# Patient Record
Sex: Female | Born: 1980 | Race: Black or African American | Hispanic: No | Smoking: Former smoker
Health system: Southern US, Community
[De-identification: ages and names within clinical notes are randomized; demographics above are authoritative.]

## PROBLEM LIST (undated history)

## (undated) DIAGNOSIS — T7840XA Allergy, unspecified, initial encounter: Secondary | ICD-10-CM

## (undated) DIAGNOSIS — J302 Other seasonal allergic rhinitis: Secondary | ICD-10-CM

## (undated) DIAGNOSIS — N920 Excessive and frequent menstruation with regular cycle: Secondary | ICD-10-CM

## (undated) DIAGNOSIS — N926 Irregular menstruation, unspecified: Secondary | ICD-10-CM

## (undated) DIAGNOSIS — M7071 Other bursitis of hip, right hip: Secondary | ICD-10-CM

## (undated) DIAGNOSIS — D251 Intramural leiomyoma of uterus: Secondary | ICD-10-CM

## (undated) DIAGNOSIS — F17200 Nicotine dependence, unspecified, uncomplicated: Secondary | ICD-10-CM

## (undated) DIAGNOSIS — J4540 Moderate persistent asthma, uncomplicated: Secondary | ICD-10-CM

## (undated) DIAGNOSIS — Z9109 Other allergy status, other than to drugs and biological substances: Secondary | ICD-10-CM

## (undated) DIAGNOSIS — F988 Other specified behavioral and emotional disorders with onset usually occurring in childhood and adolescence: Secondary | ICD-10-CM

## (undated) DIAGNOSIS — G43909 Migraine, unspecified, not intractable, without status migrainosus: Secondary | ICD-10-CM

## (undated) DIAGNOSIS — D509 Iron deficiency anemia, unspecified: Secondary | ICD-10-CM

## (undated) DIAGNOSIS — N979 Female infertility, unspecified: Secondary | ICD-10-CM

## (undated) HISTORY — PX: MYOMECTOMY: SHX85

## (undated) HISTORY — DX: Other specified behavioral and emotional disorders with onset usually occurring in childhood and adolescence: F98.8

## (undated) HISTORY — DX: Allergy, unspecified, initial encounter: T78.40XA

## (undated) HISTORY — DX: Nicotine dependence, unspecified, uncomplicated: F17.200

---
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
## 2001-12-28 ENCOUNTER — Other Ambulatory Visit: Admission: RE | Admit: 2001-12-28 | Discharge: 2001-12-28 | Payer: Self-pay | Admitting: *Deleted

## 2002-01-10 ENCOUNTER — Emergency Department (HOSPITAL_COMMUNITY): Admission: EM | Admit: 2002-01-10 | Discharge: 2002-01-10 | Payer: Self-pay | Admitting: Emergency Medicine

## 2002-01-10 ENCOUNTER — Encounter: Payer: Self-pay | Admitting: Emergency Medicine

## 2002-01-23 ENCOUNTER — Emergency Department (HOSPITAL_COMMUNITY): Admission: EM | Admit: 2002-01-23 | Discharge: 2002-01-23 | Payer: Self-pay | Admitting: Emergency Medicine

## 2002-05-31 ENCOUNTER — Encounter: Payer: Self-pay | Admitting: Gynecology

## 2002-05-31 ENCOUNTER — Ambulatory Visit (HOSPITAL_COMMUNITY): Admission: RE | Admit: 2002-05-31 | Discharge: 2002-05-31 | Payer: Self-pay | Admitting: Gynecology

## 2004-01-03 ENCOUNTER — Other Ambulatory Visit: Admission: RE | Admit: 2004-01-03 | Discharge: 2004-01-03 | Payer: Self-pay | Admitting: Gynecology

## 2004-02-21 ENCOUNTER — Emergency Department (HOSPITAL_COMMUNITY): Admission: EM | Admit: 2004-02-21 | Discharge: 2004-02-21 | Payer: Self-pay | Admitting: Family Medicine

## 2004-06-11 ENCOUNTER — Emergency Department (HOSPITAL_COMMUNITY): Admission: EM | Admit: 2004-06-11 | Discharge: 2004-06-11 | Payer: Self-pay | Admitting: Family Medicine

## 2004-08-08 ENCOUNTER — Emergency Department (HOSPITAL_COMMUNITY): Admission: EM | Admit: 2004-08-08 | Discharge: 2004-08-08 | Payer: Self-pay | Admitting: Family Medicine

## 2004-08-13 ENCOUNTER — Emergency Department (HOSPITAL_COMMUNITY): Admission: EM | Admit: 2004-08-13 | Discharge: 2004-08-13 | Payer: Self-pay | Admitting: Family Medicine

## 2004-09-04 ENCOUNTER — Emergency Department (HOSPITAL_COMMUNITY): Admission: EM | Admit: 2004-09-04 | Discharge: 2004-09-04 | Payer: Self-pay | Admitting: Emergency Medicine

## 2004-09-28 ENCOUNTER — Ambulatory Visit: Payer: Self-pay | Admitting: Nurse Practitioner

## 2004-10-01 ENCOUNTER — Ambulatory Visit: Payer: Self-pay | Admitting: *Deleted

## 2004-10-21 ENCOUNTER — Emergency Department (HOSPITAL_COMMUNITY): Admission: EM | Admit: 2004-10-21 | Discharge: 2004-10-21 | Payer: Self-pay | Admitting: Emergency Medicine

## 2004-11-23 ENCOUNTER — Ambulatory Visit: Payer: Self-pay | Admitting: Nurse Practitioner

## 2004-12-05 ENCOUNTER — Emergency Department (HOSPITAL_COMMUNITY): Admission: EM | Admit: 2004-12-05 | Discharge: 2004-12-05 | Payer: Self-pay | Admitting: Family Medicine

## 2004-12-23 ENCOUNTER — Emergency Department (HOSPITAL_COMMUNITY): Admission: EM | Admit: 2004-12-23 | Discharge: 2004-12-23 | Payer: Self-pay | Admitting: Family Medicine

## 2004-12-24 ENCOUNTER — Emergency Department (HOSPITAL_COMMUNITY): Admission: EM | Admit: 2004-12-24 | Discharge: 2004-12-24 | Payer: Self-pay | Admitting: *Deleted

## 2005-03-06 ENCOUNTER — Emergency Department (HOSPITAL_COMMUNITY): Admission: EM | Admit: 2005-03-06 | Discharge: 2005-03-06 | Payer: Self-pay | Admitting: Emergency Medicine

## 2005-04-16 ENCOUNTER — Other Ambulatory Visit: Admission: RE | Admit: 2005-04-16 | Discharge: 2005-04-16 | Payer: Self-pay | Admitting: Obstetrics and Gynecology

## 2005-05-17 ENCOUNTER — Inpatient Hospital Stay (HOSPITAL_COMMUNITY): Admission: AD | Admit: 2005-05-17 | Discharge: 2005-05-17 | Payer: Self-pay | Admitting: *Deleted

## 2005-06-15 ENCOUNTER — Emergency Department (HOSPITAL_COMMUNITY): Admission: EM | Admit: 2005-06-15 | Discharge: 2005-06-15 | Payer: Self-pay | Admitting: Family Medicine

## 2005-07-14 ENCOUNTER — Inpatient Hospital Stay (HOSPITAL_COMMUNITY): Admission: AD | Admit: 2005-07-14 | Discharge: 2005-07-14 | Payer: Self-pay | Admitting: Obstetrics and Gynecology

## 2005-08-02 ENCOUNTER — Inpatient Hospital Stay (HOSPITAL_COMMUNITY): Admission: AD | Admit: 2005-08-02 | Discharge: 2005-08-02 | Payer: Self-pay | Admitting: Obstetrics & Gynecology

## 2005-10-08 ENCOUNTER — Emergency Department: Payer: Self-pay | Admitting: Emergency Medicine

## 2006-01-10 ENCOUNTER — Emergency Department: Payer: Self-pay | Admitting: Emergency Medicine

## 2006-03-17 ENCOUNTER — Emergency Department: Payer: Self-pay | Admitting: Unknown Physician Specialty

## 2007-02-15 ENCOUNTER — Emergency Department: Payer: Self-pay | Admitting: Emergency Medicine

## 2007-05-19 ENCOUNTER — Emergency Department (HOSPITAL_COMMUNITY): Admission: EM | Admit: 2007-05-19 | Discharge: 2007-05-19 | Payer: Self-pay | Admitting: Family Medicine

## 2007-06-25 ENCOUNTER — Emergency Department (HOSPITAL_COMMUNITY): Admission: EM | Admit: 2007-06-25 | Discharge: 2007-06-25 | Payer: Self-pay | Admitting: Emergency Medicine

## 2007-07-01 ENCOUNTER — Encounter: Admission: RE | Admit: 2007-07-01 | Discharge: 2007-07-01 | Payer: Self-pay | Admitting: Family Medicine

## 2007-07-01 ENCOUNTER — Ambulatory Visit: Payer: Self-pay | Admitting: Family Medicine

## 2007-08-03 ENCOUNTER — Ambulatory Visit: Payer: Self-pay | Admitting: Family Medicine

## 2007-09-16 ENCOUNTER — Emergency Department (HOSPITAL_COMMUNITY): Admission: EM | Admit: 2007-09-16 | Discharge: 2007-09-16 | Payer: Self-pay | Admitting: Emergency Medicine

## 2007-12-16 ENCOUNTER — Ambulatory Visit: Payer: Self-pay | Admitting: Family Medicine

## 2009-01-11 ENCOUNTER — Emergency Department (HOSPITAL_COMMUNITY): Admission: EM | Admit: 2009-01-11 | Discharge: 2009-01-11 | Payer: Self-pay | Admitting: Emergency Medicine

## 2009-03-24 ENCOUNTER — Ambulatory Visit (HOSPITAL_COMMUNITY): Admission: RE | Admit: 2009-03-24 | Discharge: 2009-03-24 | Payer: Self-pay | Admitting: Obstetrics

## 2009-04-23 IMAGING — CR DG HIP (WITH OR WITHOUT PELVIS) 2-3V*L*
2 series · 2 of 2 positions shown · non-contrast
Comparison: None

CLINICAL DATA: Left hip and groin pain

LEFT HIP - COMPLETE 2+ VIEW

[view not recorded (1 of 2)]
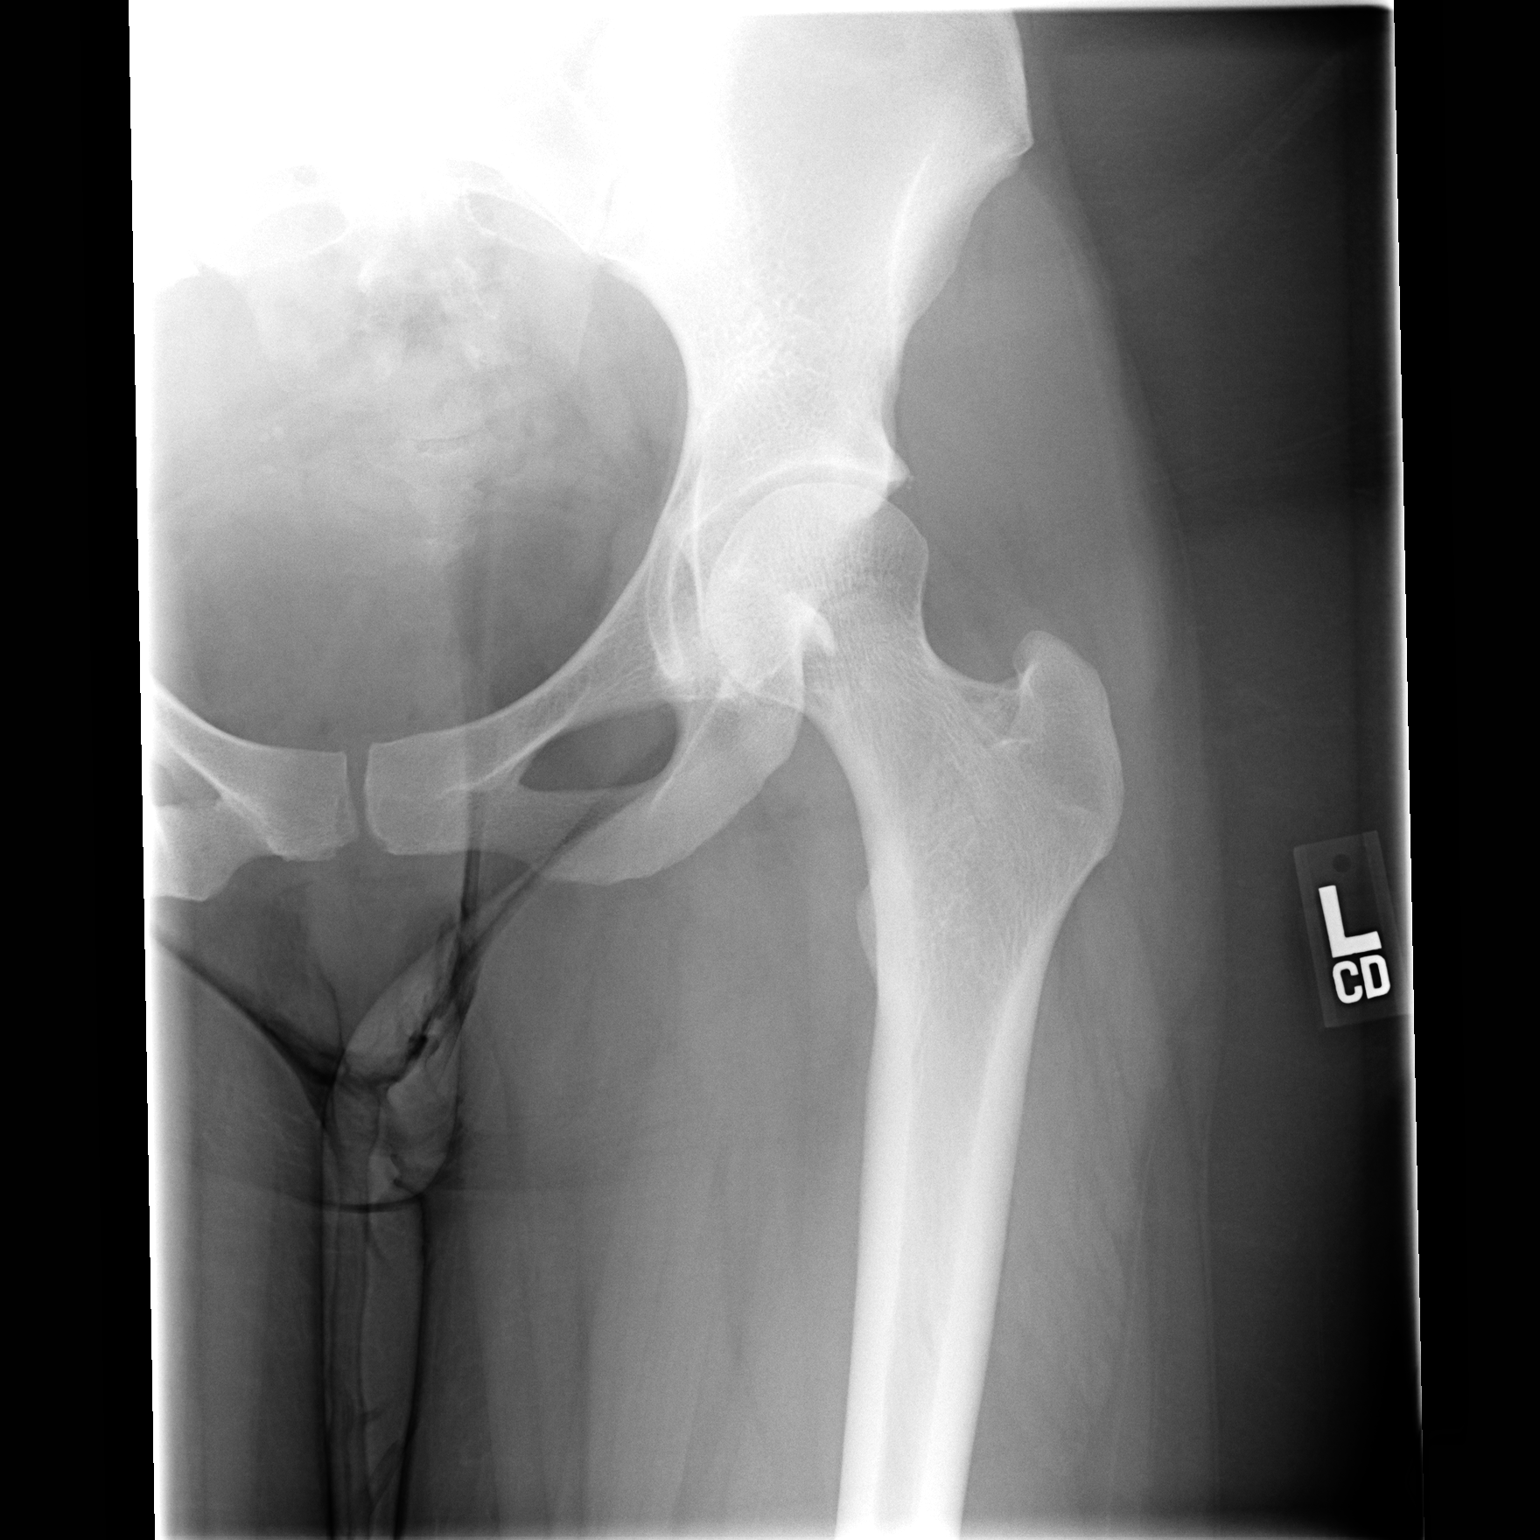

[view not recorded (2 of 2)]
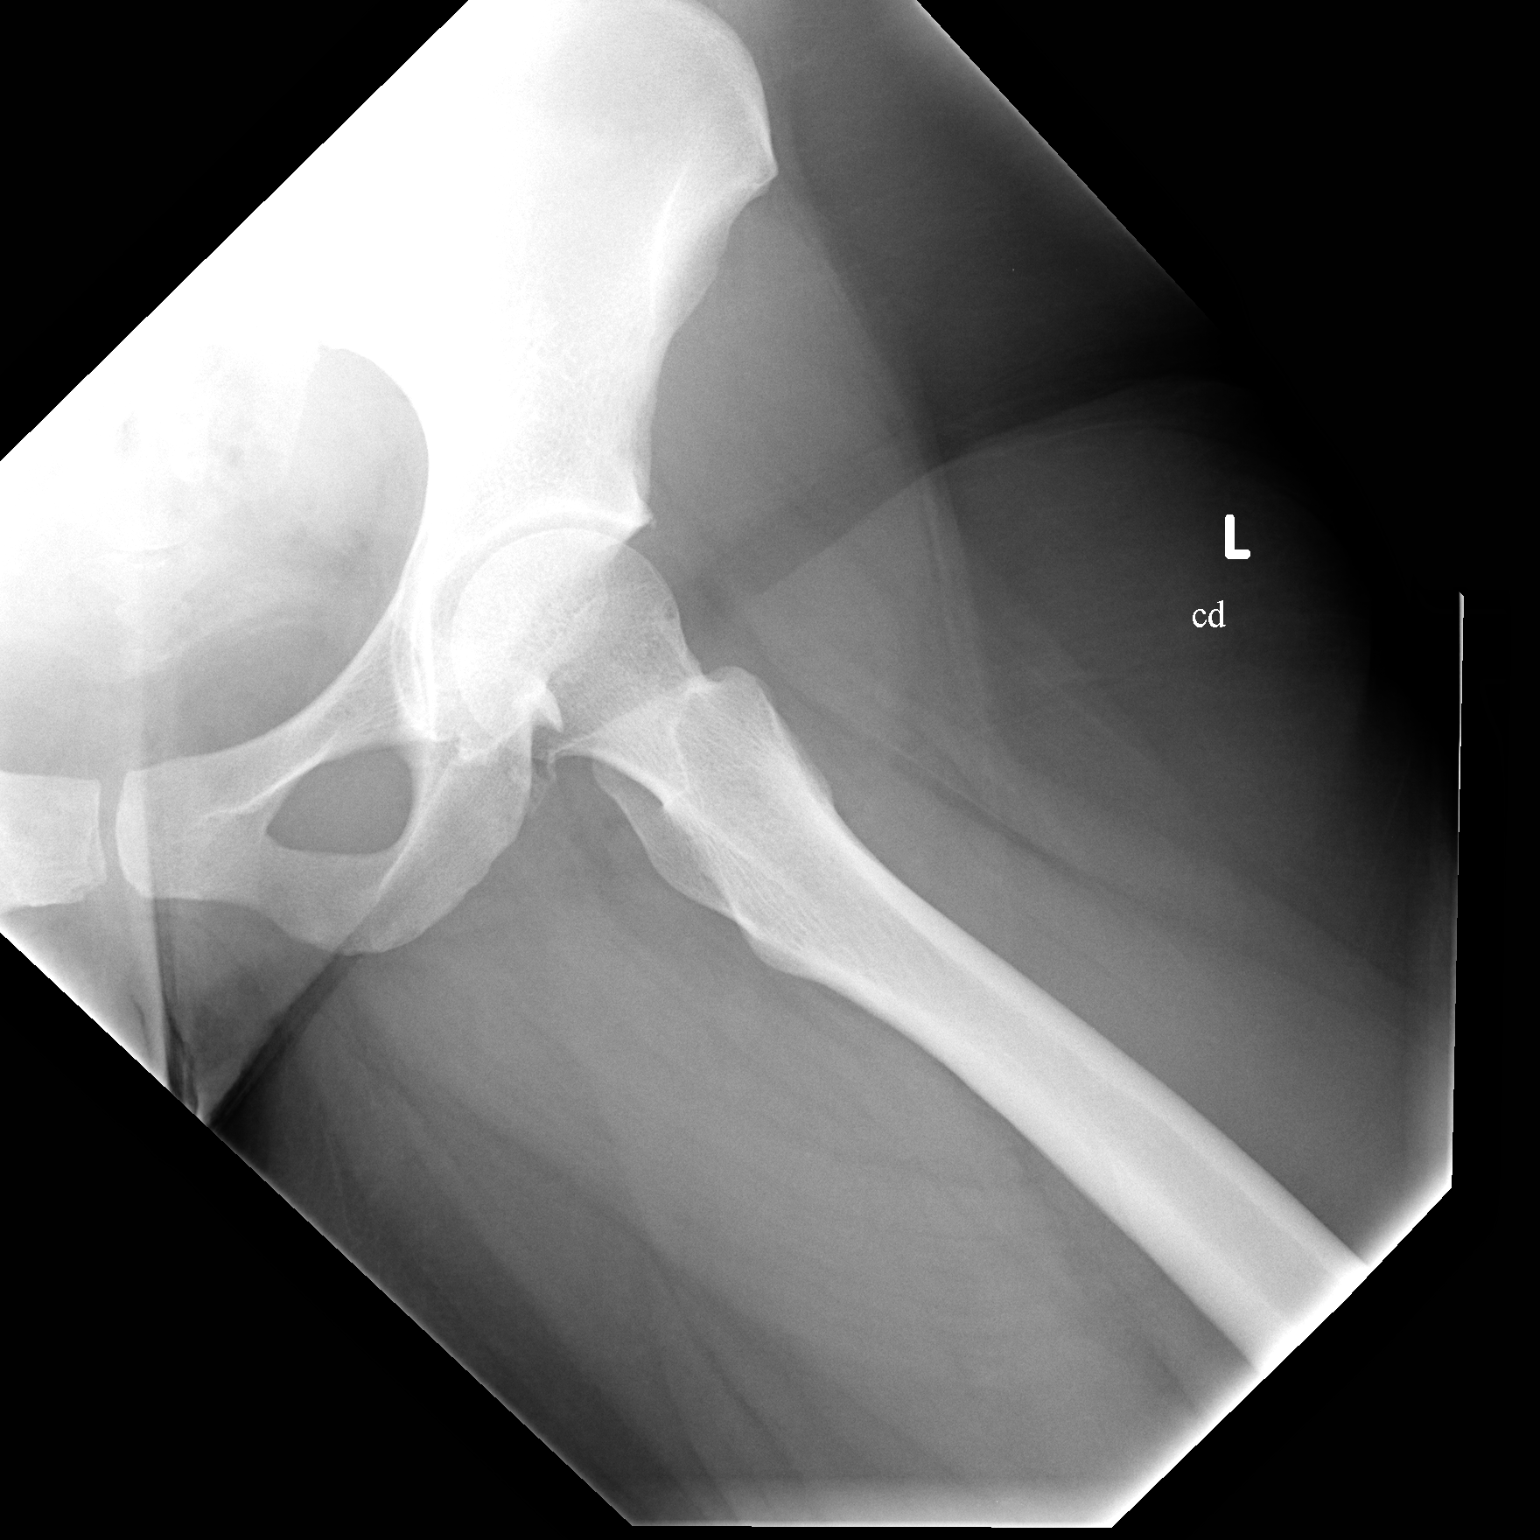

[2 of 2 positions shown; findings below may reference images not displayed]

FINDINGS: No acute abnormality is seen.  The left hip joint space
is normal.  The ramus is intact.  There is a bony density
projecting from the left acetabulum which most likely is due to
prior trauma with degenerative change.
IMPRESSION: No acute abnormality.  Probable degenerative spur emanating from
the left acetabulum.

## 2009-05-02 ENCOUNTER — Ambulatory Visit: Payer: Self-pay | Admitting: Family Medicine

## 2009-05-16 ENCOUNTER — Ambulatory Visit: Payer: Self-pay | Admitting: Family Medicine

## 2010-03-01 ENCOUNTER — Ambulatory Visit
Admission: RE | Admit: 2010-03-01 | Discharge: 2010-03-01 | Payer: Self-pay | Source: Home / Self Care | Attending: Family Medicine | Admitting: Family Medicine

## 2010-04-09 ENCOUNTER — Ambulatory Visit: Payer: Self-pay | Admitting: Physician Assistant

## 2010-04-09 ENCOUNTER — Ambulatory Visit (INDEPENDENT_AMBULATORY_CARE_PROVIDER_SITE_OTHER): Admitting: Physician Assistant

## 2010-04-09 DIAGNOSIS — L600 Ingrowing nail: Secondary | ICD-10-CM

## 2010-05-30 LAB — URINALYSIS, ROUTINE W REFLEX MICROSCOPIC
Bilirubin Urine: NEGATIVE
Glucose, UA: NEGATIVE mg/dL
Hgb urine dipstick: NEGATIVE
Ketones, ur: NEGATIVE mg/dL
Nitrite: NEGATIVE
Protein, ur: NEGATIVE mg/dL
Specific Gravity, Urine: 1.024 (ref 1.005–1.030)
Urobilinogen, UA: 0.2 mg/dL (ref 0.0–1.0)
pH: 7 (ref 5.0–8.0)

## 2010-05-30 LAB — CBC
HCT: 40.5 % (ref 36.0–46.0)
Hemoglobin: 13.5 g/dL (ref 12.0–15.0)
MCHC: 33.3 g/dL (ref 30.0–36.0)
MCV: 96.7 fL (ref 78.0–100.0)
Platelets: 285 10*3/uL (ref 150–400)
RBC: 4.19 MIL/uL (ref 3.87–5.11)
RDW: 13.1 % (ref 11.5–15.5)
WBC: 5.1 10*3/uL (ref 4.0–10.5)

## 2010-05-30 LAB — COMPREHENSIVE METABOLIC PANEL
ALT: 29 U/L (ref 0–35)
AST: 30 U/L (ref 0–37)
Albumin: 3.9 g/dL (ref 3.5–5.2)
Alkaline Phosphatase: 59 U/L (ref 39–117)
BUN: 9 mg/dL (ref 6–23)
CO2: 27 mEq/L (ref 19–32)
Calcium: 9.1 mg/dL (ref 8.4–10.5)
Chloride: 104 mEq/L (ref 96–112)
Creatinine, Ser: 0.92 mg/dL (ref 0.4–1.2)
GFR calc Af Amer: >60 mL/min (ref >60)
GFR calc non Af Amer: >60 mL/min (ref >60)
Glucose, Bld: 114 mg/dL — ABNORMAL HIGH (ref 70–99)
Potassium: 4.3 mEq/L (ref 3.5–5.1)
Sodium: 138 mEq/L (ref 135–145)
Total Bilirubin: 0.4 mg/dL (ref 0.3–1.2)
Total Protein: 7 g/dL (ref 6.0–8.3)

## 2010-05-30 LAB — DIFFERENTIAL
Basophils Absolute: 0 10*3/uL (ref 0.0–0.1)
Basophils Relative: 1 % (ref 0–1)
Eosinophils Absolute: 0.1 10*3/uL (ref 0.0–0.7)
Eosinophils Relative: 2 % (ref 0–5)
Lymphocytes Relative: 30 % (ref 12–46)
Lymphs Abs: 1.5 10*3/uL (ref 0.7–4.0)
Monocytes Absolute: 0.3 10*3/uL (ref 0.1–1.0)
Monocytes Relative: 6 % (ref 3–12)
Neutro Abs: 3.1 10*3/uL (ref 1.7–7.7)
Neutrophils Relative %: 62 % (ref 43–77)

## 2010-05-30 LAB — PREGNANCY, URINE: Preg Test, Ur: NEGATIVE

## 2010-05-30 LAB — LIPASE, BLOOD: Lipase: 16 U/L (ref 11–59)

## 2010-06-07 ENCOUNTER — Ambulatory Visit (INDEPENDENT_AMBULATORY_CARE_PROVIDER_SITE_OTHER): Admitting: Family Medicine

## 2010-06-07 DIAGNOSIS — R109 Unspecified abdominal pain: Secondary | ICD-10-CM

## 2010-07-06 ENCOUNTER — Other Ambulatory Visit: Payer: Self-pay | Admitting: Family Medicine

## 2010-07-06 ENCOUNTER — Telehealth: Payer: Self-pay | Admitting: Family Medicine

## 2010-07-06 MED ORDER — FLUCONAZOLE 150 MG PO TABS: 150.0000 mg | ORAL_TABLET | Freq: Once | ORAL | Status: AC

## 2010-07-06 NOTE — Telephone Encounter (Signed)
DONE

## 2010-08-09 ENCOUNTER — Encounter: Payer: Self-pay | Admitting: Family Medicine

## 2010-08-09 DIAGNOSIS — E669 Obesity, unspecified: Secondary | ICD-10-CM | POA: Insufficient documentation

## 2010-08-09 DIAGNOSIS — F17200 Nicotine dependence, unspecified, uncomplicated: Secondary | ICD-10-CM | POA: Insufficient documentation

## 2010-08-09 DIAGNOSIS — A6009 Herpesviral infection of other urogenital tract: Secondary | ICD-10-CM | POA: Insufficient documentation

## 2010-08-15 ENCOUNTER — Encounter: Payer: Self-pay | Admitting: Family Medicine

## 2010-08-15 ENCOUNTER — Ambulatory Visit (INDEPENDENT_AMBULATORY_CARE_PROVIDER_SITE_OTHER): Admitting: Family Medicine

## 2010-08-15 VITALS — BP 122/86 | HR 76 | Ht 65.5 in | Wt 230.0 lb

## 2010-08-15 DIAGNOSIS — J45901 Unspecified asthma with (acute) exacerbation: Secondary | ICD-10-CM | POA: Insufficient documentation

## 2010-08-15 DIAGNOSIS — Z23 Encounter for immunization: Secondary | ICD-10-CM

## 2010-08-15 DIAGNOSIS — B351 Tinea unguium: Secondary | ICD-10-CM

## 2010-08-15 DIAGNOSIS — F17200 Nicotine dependence, unspecified, uncomplicated: Secondary | ICD-10-CM

## 2010-08-15 MED ORDER — ALBUTEROL SULFATE HFA 108 (90 BASE) MCG/ACT IN AERS: 2.0000 | INHALATION_SPRAY | Freq: Four times a day (QID) | RESPIRATORY_TRACT | Status: DC | PRN

## 2010-08-15 MED ORDER — FLUTICASONE-SALMETEROL 250-50 MCG/DOSE IN AEPB: 1.0000 | INHALATION_SPRAY | Freq: Two times a day (BID) | RESPIRATORY_TRACT | Status: DC

## 2010-08-15 NOTE — Progress Notes (Signed)
Subjective:    Patient ID: Tonya Perkins, female    DOB: 24-Jun-1980, 30 y.o.   MRN: [id number]  HPI Presents for f/u on R ingrown toenail.  She was treated with Keflex for infection in the right toenail in March.  Now is having some problems with the left great toenail.  Painful to press on.  Using a topical fungicide, and wonders if this will help. She denies any further swelling/drainage/infection.  She continues to soak foot, and try to train the nail edges over the skin.  Patient is requesting Advair refill.  Asthma flares up when it is hot outside.  Ran out of inhaler 2 weeks ago.  Tried Primatene mist (OTC) but doesn't like it. Has also been out of medication for nebulizer.  Usually uses inhaler more in the summer time.  Feels like she needs inhaler more than twice a week; would like to go back on a preventive medication.   Previously took Advair, and did well, would like refill.  Review of chart doesn't show Advair Rx, but shows Singulair Rx last spring.  She didn't think it helped as much as the Advair.  She continues to smoke, but has cut down to just 1 cigarette daily  Past Medical History  Diagnosis Date  . Asthma   . Tobacco use disorder   . Genital herpes    Current Outpatient Prescriptions on File Prior to Visit  Medication Sig Dispense Refill  . Prenatal Vit-Fe Fumarate-FA (PRENATAL MULTIVITAMIN) 60-1 MG tablet Take 1 tablet by mouth daily.        Marland Kitchen acyclovir (ZOVIRAX) 400 MG tablet Take 400 mg by mouth 2 (two) times daily.         No Known Allergies  Review of Systems Denies fevers, allergy flare, headaches, GI complaints, cough, skin rashes or other concerns    Objective:   Physical Exam BP 122/86  Pulse 76  Ht 5' 5.5" (1.664 m)  Wt 230 lb (104.327 kg)  BMI 37.69 kg/m2  LMP 08/03/2010 Pleasant, obese African American female, speaking easily, in no distress HEENT:  PERRL, conjunctiva clear.  OP clear Neck:  No lymphadenopathy Heart:  Regular rate and rhythm  without murmur Lungs: Trace end-expiratory wheezing.  Fair air movement. No rales/ronchi Extremities:  No clubbing or edema.  Great toenails are very thickened, and curl inwards at both edges. No redness or tenderness at nail edges Psych:  Normal mood, affect, hygiene and grooming       Assessment & Plan:   1. Asthma exacerbation  Pneumococcal polysaccharide vaccine 23-valent greater than or equal to 2yo subcutaneous/IM, Fluticasone-Salmeterol (ADVAIR DISKUS) 250-50 MCG/DOSE AEPB, albuterol (PROVENTIL HFA;VENTOLIN HFA) 108 (90 BASE) MCG/ACT inhaler   Re-start Advair as preventative medication.  Quit smoking.   2. Onychomycosis    3. Tobacco use disorder     Strongly encouraged tobacco cessation, especially because she has asthma  4. Need for pneumococcal vaccination  Pneumococcal polysaccharide vaccine 23-valent greater than or equal to 2yo subcutaneous/IM   recommended because of her asthma and tobacco use   She is actively trying to get pregnant, therefore oral antifungals aren't recommended.  Discussed using pumice stone or file to decrease thickness of great toenails.  Continue soaking and training nail edges over the skin.  F/U with podiatrist if having ongoing nail problems.  Consider antifungal treatment when not trying for pregnancy.  Asthma: Discussed risks/benefits of Advair 250/50.  Refilled ventolin.  F/U if still needing rescue inhaler more than 2x/week.

## 2010-08-21 ENCOUNTER — Other Ambulatory Visit: Payer: Self-pay | Admitting: Family Medicine

## 2010-11-26 ENCOUNTER — Other Ambulatory Visit (HOSPITAL_COMMUNITY): Payer: Self-pay | Admitting: Obstetrics & Gynecology

## 2010-11-26 DIAGNOSIS — N979 Female infertility, unspecified: Secondary | ICD-10-CM

## 2010-12-03 ENCOUNTER — Ambulatory Visit (HOSPITAL_COMMUNITY)
Admission: RE | Admit: 2010-12-03 | Discharge: 2010-12-03 | Disposition: A | Discharge status: Home / Self Care | Source: Ambulatory Visit | Attending: Obstetrics & Gynecology | Admitting: Obstetrics & Gynecology

## 2010-12-03 DIAGNOSIS — N979 Female infertility, unspecified: Secondary | ICD-10-CM | POA: Insufficient documentation

## 2010-12-03 MED ORDER — IOHEXOL 300 MG/ML  SOLN: 7.0000 mL | Freq: Once | INTRAMUSCULAR | Status: AC | PRN

## 2011-01-15 IMAGING — US US PELVIS COMPLETE MODIFY
1 series · 14 of 25 positions shown · non-contrast
Comparison: None.

CLINICAL DATA: Anovulation.  No prior surgery.  LMP 03/12/2009

TRANSABDOMINAL AND TRANSVAGINAL ULTRASOUND OF PELVIS
TECHNIQUE: Both transabdominal and transvaginal ultrasound
examinations of the pelvis were performed including evaluation of
the uterus, ovaries, adnexal regions, and pelvic cul-de-sac.

[Series 1: us transvaginal non-ob · 0.26mm/px · 14 of 37 slices shown]
[im 1/37]
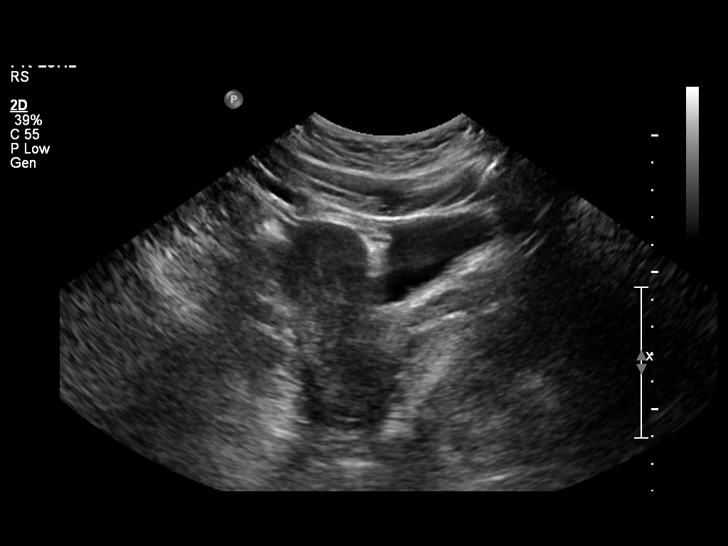
[im 4/37]
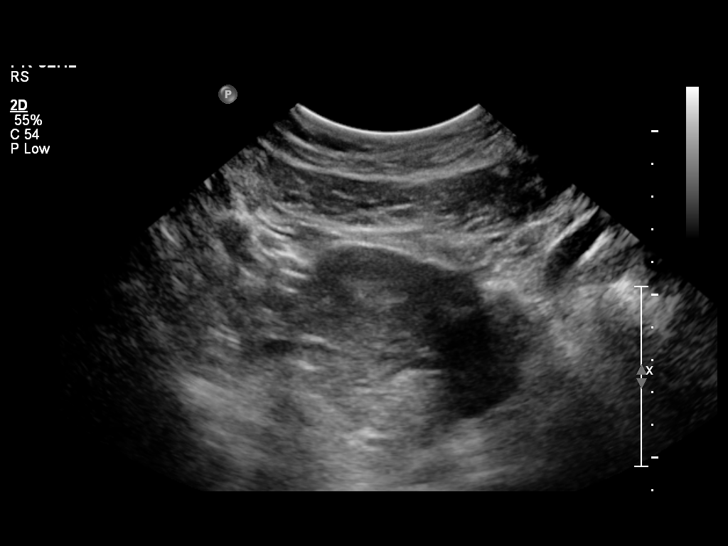
[im 7/37]
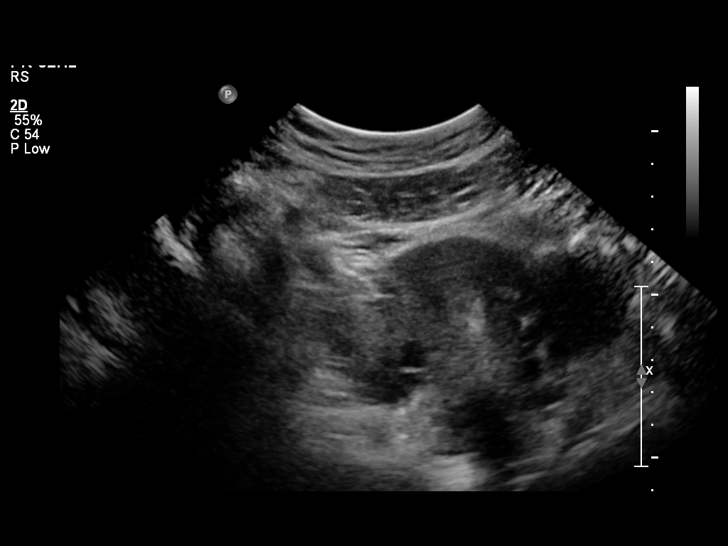
[im 10/37]
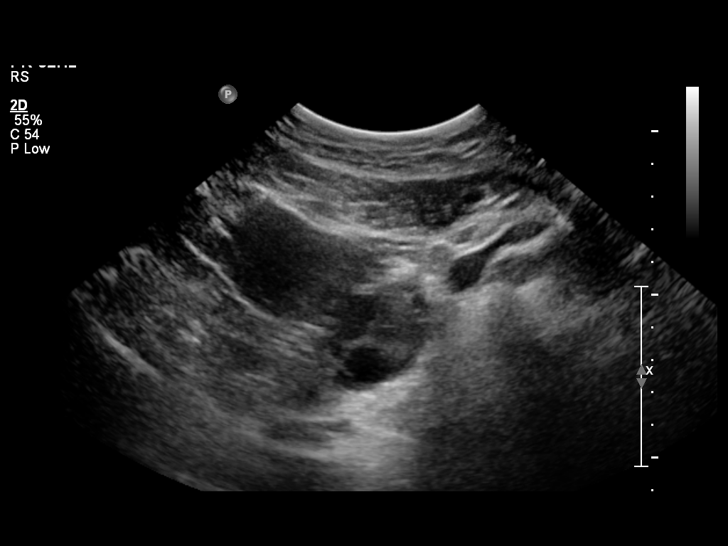
[im 13/37]
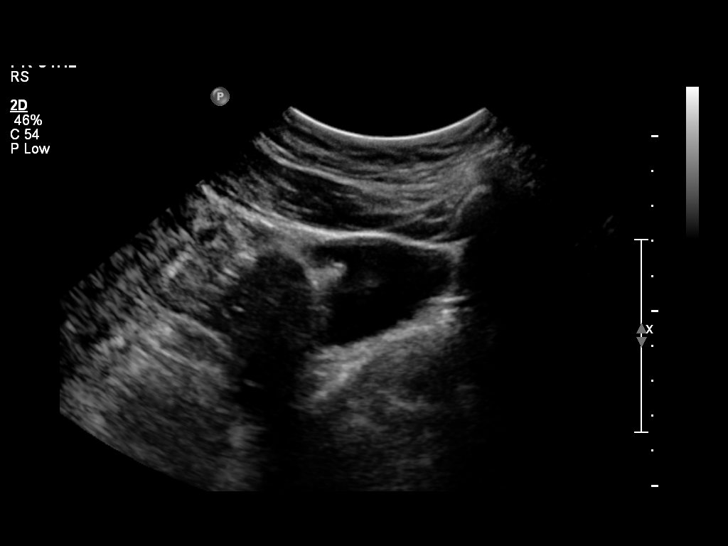
[im 14/37]
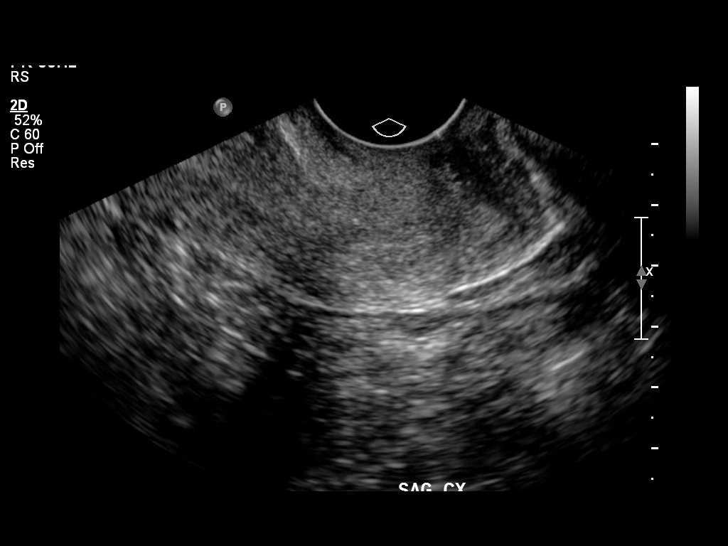
[im 17/37]
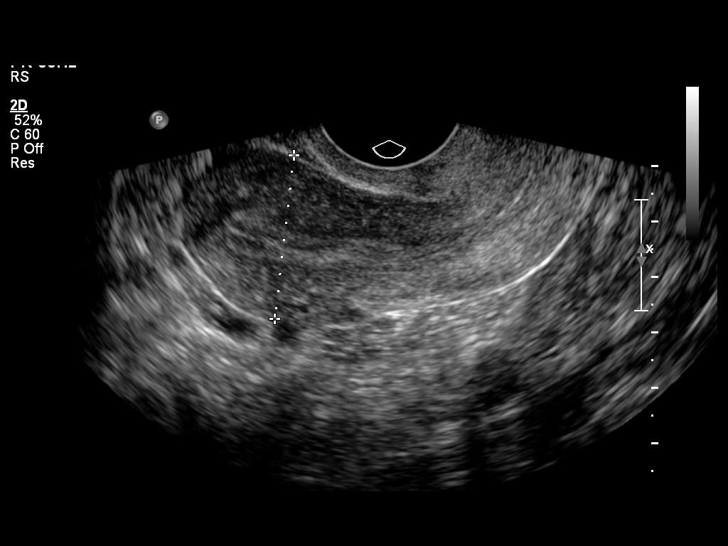
[im 20/37]
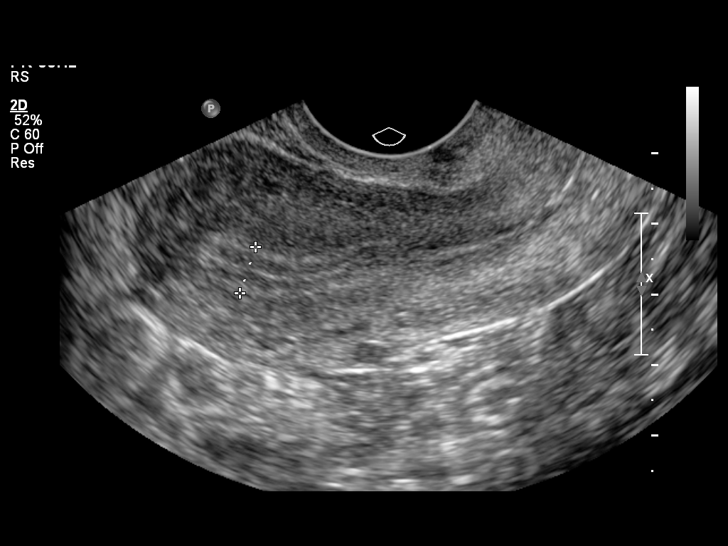
[im 23/37]
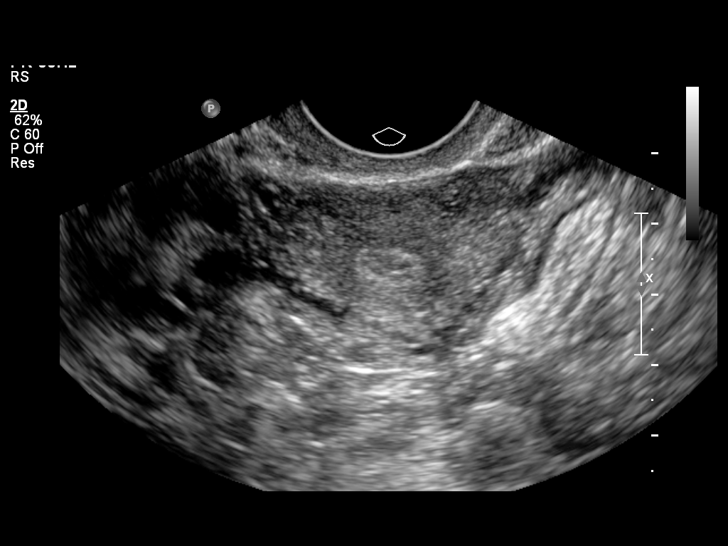
[im 25/37]
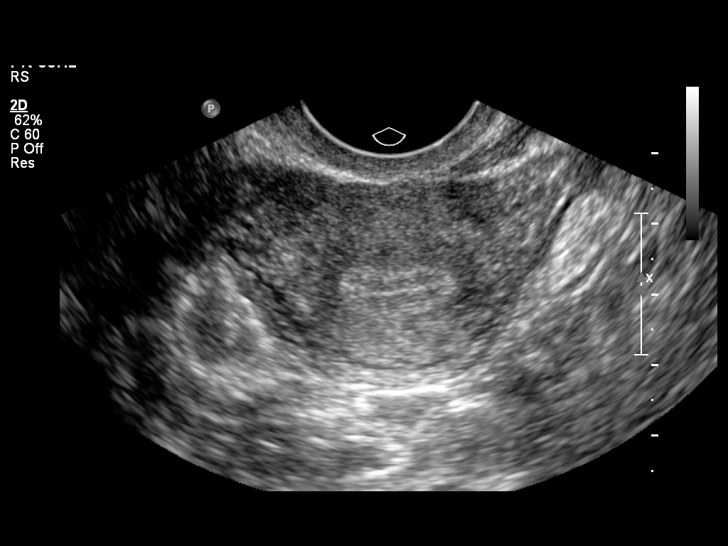
[im 28/37]
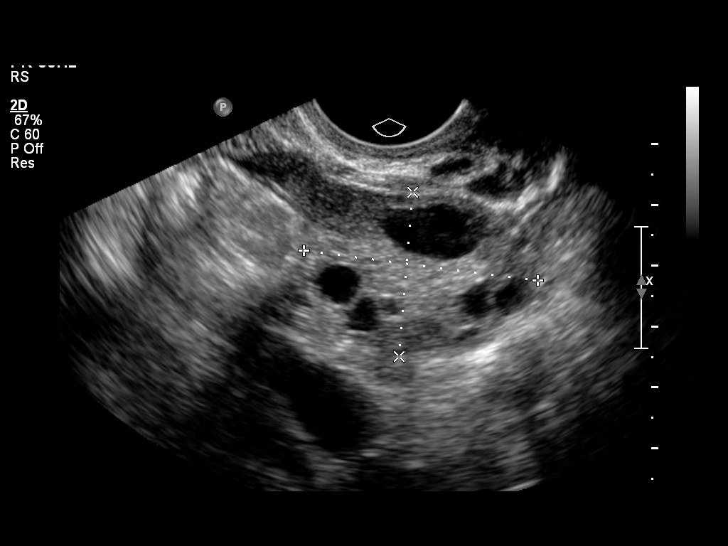
[im 31/37]
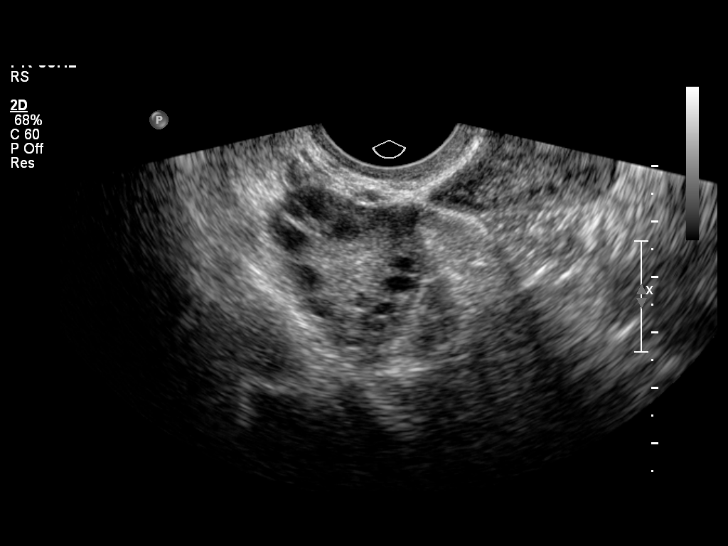
[im 34/37]
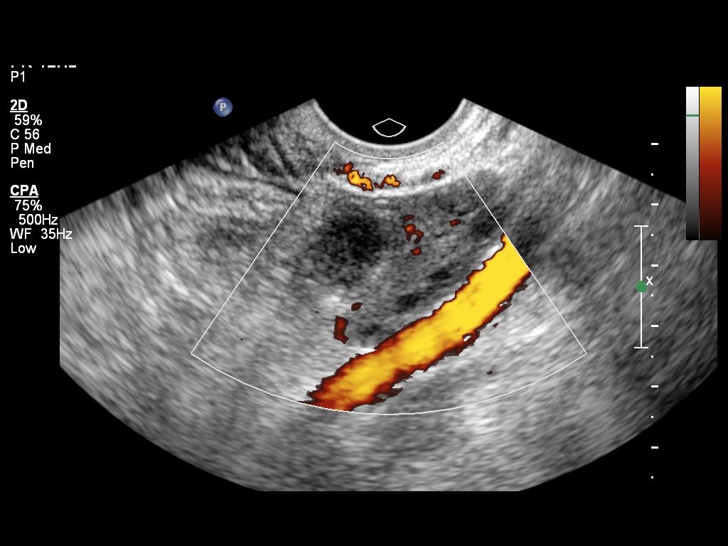
[im 37/37]
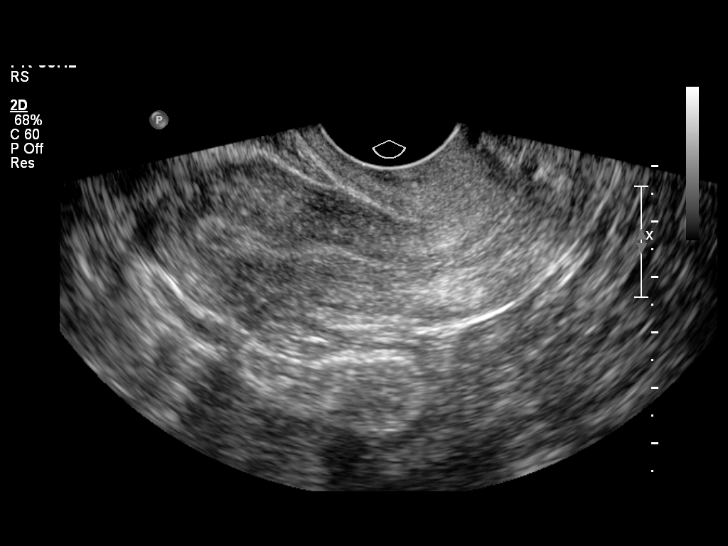

[14 of 25 positions shown; findings below may reference images not displayed]

FINDINGS: Uterus demonstrates a sagittal length of 7.4 cm, an AP width of
cm and a transverse width of 3.7 cm.  A homogeneous uterine
myometrium is seen.

Endometrium is tri- layered with an AP width of 6.9 mm.  This would
correlate with a periovulatory endometrial stripe and correspond
with the patient's given LMP of 03/12/2009.  No areas of focal
thickening or inhomogeneity are noted.

Right Ovary has a normal appearance measuring 3.9 x 2.7 x 2.6 cm
and containing multiple follicles

Left Ovary has a normal appearance measuring 4.3 x 2.1 x 3.0 cm and
containing multiple follicles

Other Findings:  No pelvic fluid or separate adnexal masses are
seen.
IMPRESSION: Normal periovulatory pelvic ultrasound.

## 2011-02-02 ENCOUNTER — Encounter (HOSPITAL_COMMUNITY): Payer: Self-pay

## 2011-02-02 ENCOUNTER — Emergency Department (INDEPENDENT_AMBULATORY_CARE_PROVIDER_SITE_OTHER)
Admission: EM | Admit: 2011-02-02 | Discharge: 2011-02-02 | Disposition: A | Discharge status: Home / Self Care | Source: Home / Self Care | Attending: Family Medicine | Admitting: Family Medicine

## 2011-02-02 DIAGNOSIS — R6889 Other general symptoms and signs: Secondary | ICD-10-CM

## 2011-02-02 DIAGNOSIS — J111 Influenza due to unidentified influenza virus with other respiratory manifestations: Secondary | ICD-10-CM

## 2011-02-02 MED ORDER — ALBUTEROL SULFATE HFA 108 (90 BASE) MCG/ACT IN AERS: 2.0000 | INHALATION_SPRAY | RESPIRATORY_TRACT | Status: DC | PRN

## 2011-02-02 MED ORDER — ACETAMINOPHEN 325 MG PO TABS
ORAL_TABLET | ORAL | Status: AC
  Filled 2011-02-02: qty 3

## 2011-02-02 MED ORDER — ACETAMINOPHEN 500 MG PO TABS
1000.0000 mg | ORAL_TABLET | Freq: Four times a day (QID) | ORAL | Status: DC | PRN
  Administered 2011-02-02: 1000 mg via ORAL

## 2011-02-02 NOTE — ED Provider Notes (Signed)
History     CSN: [id number] Arrival date & time: 02/02/2011  9:15 AM   First MD Initiated Contact with Patient 02/02/11 [phone]      Chief Complaint  Patient presents with  . Influenza    Pt has fever, cough and bodyaches since yesterday    (Consider location/radiation/quality/duration/timing/severity/associated sxs/prior treatment) HPI Comments: Tonya Perkins presents for evaluation of dry cough, fever, body aches, headaches, and sinus pressure. She reports hx of asthma; she is using her albuterol once daily with minimal relief.   Patient is a 30 y.o. female presenting with URI. The history is provided by the patient.  URI The primary symptoms include fever, fatigue, headaches, sore throat, cough and wheezing. The current episode started yesterday. This is a new problem.  The fever began yesterday. The maximum temperature recorded prior to her arrival was unknown. The temperature was taken by an oral thermometer.  The fatigue began yesterday. The fatigue has been unchanged since its onset.  The headache began yesterday. Headache is a new problem.  The cough began yesterday. The cough is new. The cough is non-productive and dry.  Wheezing began yesterday. The wheezing has been unchanged since its onset. The patient's medical history is significant for asthma.  The onset of the illness is associated with exposure to sick contacts. Symptoms associated with the illness include sinus pressure.    Past Medical History  Diagnosis Date  . Asthma   . Tobacco use disorder   . Genital herpes     Past Surgical History  Procedure Date  . Im nailing humerus   . Cervical fusion     History reviewed. No pertinent family history.  History  Substance Use Topics  . Smoking status: Current Everyday Smoker -- 0.1 packs/day    Types: Cigarettes  . Smokeless tobacco: Not on file   Comment: cut back to 1 cigarette a day  . Alcohol Use: Yes     1-2 drinks on weekends    OB History    Grav Para  Term Preterm Abortions TAB SAB Ect Mult Living                  Review of Systems  Constitutional: Positive for fever and fatigue.  HENT: Positive for sore throat and sinus pressure.   Eyes: Negative.   Respiratory: Positive for cough and wheezing.   Cardiovascular: Negative.   Gastrointestinal: Negative.   Genitourinary: Negative.   Musculoskeletal: Negative.   Neurological: Positive for headaches.    Allergies  Review of patient's allergies indicates no known allergies.  Home Medications   Current Outpatient Rx  Name Route Sig Dispense Refill  . MUCINEX CHILD COLD PO Oral Take by mouth.      . ACYCLOVIR 400 MG PO TABS  TAKE 1 TABLET BY MOUTH TWICE DAILY 60 tablet 0  . ALBUTEROL SULFATE HFA 108 (90 BASE) MCG/ACT IN AERS Inhalation Inhale 2 puffs into the lungs every 4 (four) hours as needed for wheezing or shortness of breath. 1 Inhaler 0  . FLUTICASONE-SALMETEROL 250-50 MCG/DOSE IN AEPB Inhalation Inhale 1 puff into the lungs 2 (two) times daily. Rinse mouth after use 60 each 5  . PRENATAL RX 60-1 MG PO TABS Oral Take 1 tablet by mouth daily.       BP 132/57  Pulse 107  Temp(Src) 100.8 F (38.2 C) (Oral)  Resp 19  SpO2 100%  LMP 01/20/2011  Physical Exam  Nursing note and vitals reviewed. Constitutional: She is oriented to person, place,  and time. She appears well-developed and well-nourished.  HENT:  Head: Normocephalic and atraumatic.  Right Ear: Tympanic membrane and external ear normal.  Left Ear: Tympanic membrane and external ear normal.  Nose: Nose normal.  Mouth/Throat: Uvula is midline, oropharynx is clear and moist and mucous membranes are normal.  Eyes: EOM are normal. Pupils are equal, round, and reactive to light.  Neck: Normal range of motion.  Cardiovascular: Normal rate and regular rhythm.   Pulmonary/Chest: Effort normal and breath sounds normal.  Neurological: She is alert and oriented to person, place, and time.  Skin: Skin is warm and dry.     ED Course  Procedures (including critical care time)  Labs Reviewed - No data to display No results found.   1. Influenza-like illness       MDM  Influenza-like illness        Richardo Priest, MD 02/02/11 1008

## 2011-02-04 ENCOUNTER — Telehealth (HOSPITAL_COMMUNITY): Payer: Self-pay | Admitting: *Deleted

## 2011-02-07 ENCOUNTER — Encounter: Payer: Self-pay | Admitting: Family Medicine

## 2011-02-07 ENCOUNTER — Ambulatory Visit (INDEPENDENT_AMBULATORY_CARE_PROVIDER_SITE_OTHER): Admitting: Family Medicine

## 2011-02-07 VITALS — BP 128/94 | HR 76 | Temp 98.0°F

## 2011-02-07 DIAGNOSIS — J45901 Unspecified asthma with (acute) exacerbation: Secondary | ICD-10-CM

## 2011-02-07 DIAGNOSIS — K5900 Constipation, unspecified: Secondary | ICD-10-CM

## 2011-02-07 DIAGNOSIS — R109 Unspecified abdominal pain: Secondary | ICD-10-CM

## 2011-02-07 DIAGNOSIS — A6000 Herpesviral infection of urogenital system, unspecified: Secondary | ICD-10-CM

## 2011-02-07 DIAGNOSIS — Z23 Encounter for immunization: Secondary | ICD-10-CM

## 2011-02-07 LAB — POCT URINALYSIS DIPSTICK
Bilirubin, UA: NEGATIVE
Blood, UA: NEGATIVE
Glucose, UA: NEGATIVE
Ketones, UA: NEGATIVE
Leukocytes, UA: NEGATIVE
Nitrite, UA: NEGATIVE
Protein, UA: NEGATIVE
Spec Grav, UA: 1.015
Urobilinogen, UA: NEGATIVE
pH, UA: 5

## 2011-02-07 MED ORDER — ALBUTEROL SULFATE HFA 108 (90 BASE) MCG/ACT IN AERS: 2.0000 | INHALATION_SPRAY | RESPIRATORY_TRACT | Status: DC | PRN

## 2011-02-07 MED ORDER — MONTELUKAST SODIUM 10 MG PO TABS: 10.0000 mg | ORAL_TABLET | Freq: Every day | ORAL | Status: DC

## 2011-02-07 MED ORDER — ACYCLOVIR 400 MG PO TABS: 400.0000 mg | ORAL_TABLET | Freq: Two times a day (BID) | ORAL | Status: DC

## 2011-02-07 NOTE — Progress Notes (Addendum)
Patient presents with complaint of L sided abdominal pain.  Pain since Saturday, was worse on Tuesday when she made appt, but now is getting a little better.  She had worked out at [employer] the day prior to onset of pain, working on abs.  Having some mild constipation.  Made appointment to make sure it wasn't anything with her kidneys, as she had been taking Alka Selzer cold plus medication last week for a cold.  Denies dysuria, hematuria or other urinary complaints.  Some pain in her lower back, L>R side. Took a laxative (milk of magnesia) and had a bowel movement.  Pain somewhat improved after BM.  URI last week, resolved.  She quit smoking in June, but her husband still smokes in the house.  Hasn't gotten a flu shot--offered through work but declined.  Works in healthcare, and is an asthmatic.  Not compliant with her Advair due to cost. Needing to use her albuterol daily, sometimes twice daily.    Past Medical History  Diagnosis Date  . Asthma   . Tobacco use disorder   . Genital herpes     Past Surgical History  Procedure Date  . Im nailing humerus 2001    ORIF  . Cervical fusion 2001    History   Social History  . Marital Status: Married    Spouse Name: N/A    Number of Children: N/A  . Years of Education: N/A   Occupational History  . Not on file.   Social History Main Topics  . Smoking status: Former Smoker -- 0.0 packs/day    Types: Cigarettes    Quit date: 08/15/2010  . Smokeless tobacco: Never Used  . Alcohol Use: Yes     1-2 drinks on weekends  . Drug Use: No  . Sexually Active: Not on file   Other Topics Concern  . Not on file   Social History Narrative  . No narrative on file   Current Outpatient Prescriptions on File Prior to Visit  Medication Sig Dispense Refill  . Prenatal Vit-Fe Fumarate-FA (PRENATAL MULTIVITAMIN) 60-1 MG tablet Take 1 tablet by mouth daily.       . montelukast (SINGULAIR) 10 MG tablet Take 1 tablet (10 mg total) by mouth at bedtime.   30 tablet  5  . Phenylephrine-DM-GG [facility] CHILD COLD PO) Take by mouth.         No Known Allergies  ROS:  Denies fevers, nausea, vomiting, blood in stool, dysuria, hematuria, incontinence. +wheezing/shortness of breath.  Denies vaginal discharge, odor, itch.  Denies pregnancy  PHYSICAL EXAM: BP 128/94  Pulse 76  Temp(Src) 98 F (36.7 C) (Oral)  LMP 01/20/2011 Well developed, pleasant female, in no distress Neck: no lymphadenopathy or mass Heart: regular rate and rhythm without murmur Lungs: Good air movement, somewhat coarse breath sounds, improved after coughing.  Mild expiratory wheezes throughout, no prolongation of expiratory phase Abdomen:  Soft. Mild TTP with deep palpation at L abdomen, no mass, no rebound or guarding.  Along whole left side of abdomen, mainly mid-portion Back: no spine tenderness.  No CVA tenderness  ASSESSMENT/PLAN: 1. Abdominal pain, acute  POCT Urinalysis Dipstick  2. Asthma flare  albuterol (PROVENTIL HFA;VENTOLIN HFA) 108 (90 BASE) MCG/ACT inhaler, montelukast (SINGULAIR) 10 MG tablet  3. Genital herpes  acyclovir (ZOVIRAX) 400 MG tablet  4. Need for influenza vaccination  Flu vaccine greater than or equal to 3yo preservative free IM  5. Constipation     L sided abdominal pain--most likely  due to constipation.  May continue to use Miralax daily until gets good results.  Discussed increased fluid intake and high fiber diet, possible probiotics vs Miralax as needed  Flu shot given Asthma--Trial of Singulair which may be more affordable since it went generic.  If still needing daily rescue inhaler, then consider starting inhaled steroid back Husband advised to smoke outside (and encouraged to quit smoking also)

## 2011-02-07 NOTE — Patient Instructions (Signed)
Drink plenty of fluids, high fiber diet.  Consider trial of probiotics (Ie Activia yogurt or Align).  You may use Miralax daily until you get good results and stools become loose, then can back down in frequency.  Return to follow up on your asthma within a month if you continue to need your albuterol more than 2-3 times a week after starting the Singulair

## 2011-05-22 ENCOUNTER — Encounter (HOSPITAL_COMMUNITY): Payer: Self-pay | Admitting: Emergency Medicine

## 2011-05-22 ENCOUNTER — Emergency Department (HOSPITAL_COMMUNITY)
Admission: EM | Admit: 2011-05-22 | Discharge: 2011-05-22 | Disposition: A | Discharge status: Home / Self Care | Attending: Emergency Medicine | Admitting: Emergency Medicine

## 2011-05-22 DIAGNOSIS — J45909 Unspecified asthma, uncomplicated: Secondary | ICD-10-CM | POA: Insufficient documentation

## 2011-05-22 DIAGNOSIS — K2970 Gastritis, unspecified, without bleeding: Secondary | ICD-10-CM | POA: Insufficient documentation

## 2011-05-22 DIAGNOSIS — R1110 Vomiting, unspecified: Secondary | ICD-10-CM | POA: Insufficient documentation

## 2011-05-22 DIAGNOSIS — K2990 Gastroduodenitis, unspecified, without bleeding: Secondary | ICD-10-CM | POA: Insufficient documentation

## 2011-05-22 LAB — URINALYSIS, ROUTINE W REFLEX MICROSCOPIC
Bilirubin Urine: NEGATIVE
Glucose, UA: NEGATIVE mg/dL
Hgb urine dipstick: NEGATIVE
Ketones, ur: NEGATIVE mg/dL
Leukocytes, UA: NEGATIVE
Nitrite: NEGATIVE
Protein, ur: 30 mg/dL — AB
Specific Gravity, Urine: 1.025 (ref 1.005–1.030)
Urobilinogen, UA: 1 mg/dL (ref 0.0–1.0)
pH: 7.5 (ref 5.0–8.0)

## 2011-05-22 LAB — COMPREHENSIVE METABOLIC PANEL
ALT: 21 U/L (ref 0–35)
AST: 22 U/L (ref 0–37)
Albumin: 3.9 g/dL (ref 3.5–5.2)
Alkaline Phosphatase: 67 U/L (ref 39–117)
BUN: 9 mg/dL (ref 6–23)
CO2: 27 mEq/L (ref 19–32)
Calcium: 9.3 mg/dL (ref 8.4–10.5)
Chloride: 103 mEq/L (ref 96–112)
Creatinine, Ser: 0.78 mg/dL (ref 0.50–1.10)
GFR calc Af Amer: >90 mL/min (ref >90)
GFR calc non Af Amer: >90 mL/min (ref >90)
Glucose, Bld: 118 mg/dL — ABNORMAL HIGH (ref 70–99)
Potassium: 4.1 mEq/L (ref 3.5–5.1)
Sodium: 139 mEq/L (ref 135–145)
Total Bilirubin: 0.3 mg/dL (ref 0.3–1.2)
Total Protein: 7.5 g/dL (ref 6.0–8.3)

## 2011-05-22 LAB — ETHANOL: Alcohol, Ethyl (B): 38 mg/dL — ABNORMAL HIGH (ref 0–11)

## 2011-05-22 LAB — URINE MICROSCOPIC-ADD ON

## 2011-05-22 LAB — PREGNANCY, URINE: Preg Test, Ur: NEGATIVE

## 2011-05-22 LAB — LIPASE, BLOOD: Lipase: 17 U/L (ref 11–59)

## 2011-05-22 MED ORDER — MECLIZINE HCL 50 MG PO TABS: 50.0000 mg | ORAL_TABLET | Freq: Three times a day (TID) | ORAL | Status: AC | PRN

## 2011-05-22 MED ORDER — MECLIZINE HCL 25 MG PO TABS
25.0000 mg | ORAL_TABLET | Freq: Once | ORAL | Status: AC
  Administered 2011-05-22: 25 mg via ORAL
  Filled 2011-05-22: qty 1

## 2011-05-22 MED ORDER — SODIUM CHLORIDE 0.9 % IV BOLUS (SEPSIS)
1000.0000 mL | Freq: Once | INTRAVENOUS | Status: AC
  Administered 2011-05-22: 1000 mL via INTRAVENOUS

## 2011-05-22 MED ORDER — PROMETHAZINE HCL 25 MG/ML IJ SOLN
25.0000 mg | Freq: Once | INTRAMUSCULAR | Status: AC
  Administered 2011-05-22: 25 mg via INTRAVENOUS
  Filled 2011-05-22: qty 1

## 2011-05-22 MED ORDER — GI COCKTAIL ~~LOC~~
30.0000 mL | Freq: Once | ORAL | Status: AC
  Administered 2011-05-22: 30 mL via ORAL
  Filled 2011-05-22: qty 30

## 2011-05-22 MED ORDER — PROMETHAZINE HCL 25 MG PO TABS: 25.0000 mg | ORAL_TABLET | Freq: Four times a day (QID) | ORAL | Status: AC | PRN

## 2011-05-22 MED ORDER — ONDANSETRON HCL 4 MG/2ML IJ SOLN
4.0000 mg | Freq: Once | INTRAMUSCULAR | Status: AC
  Administered 2011-05-22: 4 mg via INTRAVENOUS
  Filled 2011-05-22: qty 2

## 2011-05-22 NOTE — Discharge Instructions (Signed)
Your blood tests do not show any significant illness.  Use meclizine for dizziness, and Phenergan for nausea and vomiting.  Followup with your Dr. if your symptoms.  Last more than 48 hours.  Return for worse or uncontrolled symptoms

## 2011-05-22 NOTE — ED Notes (Signed)
MD at bedside. 

## 2011-05-22 NOTE — ED Provider Notes (Signed)
History     CSN: [id number]  Arrival date & time 05/22/11  4098   First MD Initiated Contact with Patient 05/22/11 0800      Chief Complaint  Patient presents with  . Emesis    (Consider location/radiation/quality/duration/timing/severity/associated sxs/prior treatment) Patient is a 31 y.o. female presenting with vomiting. The history is provided by the patient and the spouse.  Emesis  Associated symptoms include abdominal pain. Pertinent negatives include no chills, no diarrhea, no fever and no headaches.   a 31 year old, female, who complains of vomiting up acid-like material for the past 2 hours.  She also complains of epigastric abdominal pain.  She's had loose stool, but no diarrhea.  She denies fevers, chills, rash, or recent antibiotic use.  She denies change in her diet.  She had been drinking alcohol last night.  She denies exposure to anybody with similar symptoms.  Last Menstrual period was on March 14 and was normal.  Past Medical History  Diagnosis Date  . Asthma   . Tobacco use disorder   . Genital herpes     Past Surgical History  Procedure Date  . Im nailing humerus 2001    ORIF  . Cervical fusion 2001    No family history on file.  History  Substance Use Topics  . Smoking status: Current Everyday Smoker -- 0.0 packs/day    Types: Cigarettes    Last Attempt to Quit: 08/15/2010  . Smokeless tobacco: Never Used  . Alcohol Use: Yes     1-2 drinks on weekends    OB History    Grav Para Term Preterm Abortions TAB SAB Ect Mult Living                  Review of Systems  Constitutional: Negative for fever and chills.  Gastrointestinal: Positive for nausea, vomiting and abdominal pain. Negative for diarrhea.  Genitourinary: Negative for dysuria and urgency.  Skin: Negative for rash.  Neurological: Positive for dizziness. Negative for headaches.  All other systems reviewed and are negative.    Allergies  Review of patient's allergies indicates no  known allergies.  Home Medications   Current Outpatient Rx  Name Route Sig Dispense Refill  . ACYCLOVIR 400 MG PO TABS Oral Take 1 tablet (400 mg total) by mouth 2 (two) times daily. 60 tablet 5  . ALBUTEROL SULFATE HFA 108 (90 BASE) MCG/ACT IN AERS Inhalation Inhale 2 puffs into the lungs every 4 (four) hours as needed for wheezing or shortness of breath. 1 Inhaler 1  . MONTELUKAST SODIUM 10 MG PO TABS Oral Take 1 tablet (10 mg total) by mouth at bedtime. 30 tablet 5  . MUCINEX CHILD COLD PO Oral Take by mouth.      Marland Kitchen PRENATAL RX 60-1 MG PO TABS Oral Take 1 tablet by mouth daily.       BP 126/92  Pulse 100  Temp 97.6 F (36.4 C)  Resp 20  SpO2 100%  LMP 05/09/2011  Physical Exam  Nursing note and vitals reviewed. Constitutional: She is oriented to person, place, and time. She appears well-developed and well-nourished.       Ill Appearing  HENT:  Head: Normocephalic and atraumatic.  Eyes: Conjunctivae and EOM are normal.  Neck: Normal range of motion.  Cardiovascular: Normal rate.   No murmur heard. Pulmonary/Chest: Effort normal. No respiratory distress. She has no wheezes. She has no rales.  Abdominal: Soft. Bowel sounds are normal. She exhibits no distension. There is tenderness. There  is no rebound and no guarding.       Mild epigastric tenderness, with no peritoneal signs  Musculoskeletal: Normal range of motion.  Neurological: She is alert and oriented to person, place, and time.  Skin: Skin is warm and dry.  Psychiatric: She has a normal mood and affect. Thought content normal.    ED Course  Procedures (including critical care time) 31 year old, female, presents to the ER with epigastric pain and vomiting for a few hours after drinking alcohol last night.  She has no diarrhea.  No other pain.  No exposure to others with similar symptoms and no history of dietary change.  This may be alcoholic gastritis.  Or a viral gastritis.  We will establish IV perform laboratory  testing, and symptomatic treatment   Labs Reviewed  COMPREHENSIVE METABOLIC PANEL  LIPASE, BLOOD  ETHANOL  URINALYSIS, ROUTINE W REFLEX MICROSCOPIC  PREGNANCY, URINE   No results found.   No diagnosis found.  10:52 AM Feels better. Still getting second iv  11:46 AM sxs resolved   MDM   gastritis        Cheri Guppy, MD 05/22/11 1146

## 2011-05-22 NOTE — ED Notes (Signed)
Pt presenting to ed with c/o vomiting x 2 hours pt states she has loose stool. Pt states small amount of left side abdominal pain but it's not excruciating

## 2011-05-29 ENCOUNTER — Ambulatory Visit: Admitting: Family Medicine

## 2011-05-30 ENCOUNTER — Encounter: Payer: Self-pay | Admitting: Family Medicine

## 2011-05-30 ENCOUNTER — Ambulatory Visit (INDEPENDENT_AMBULATORY_CARE_PROVIDER_SITE_OTHER): Admitting: Family Medicine

## 2011-05-30 VITALS — BP 148/108 | HR 72 | Ht 65.0 in | Wt 228.0 lb

## 2011-05-30 DIAGNOSIS — R42 Dizziness and giddiness: Secondary | ICD-10-CM

## 2011-05-30 DIAGNOSIS — J309 Allergic rhinitis, unspecified: Secondary | ICD-10-CM

## 2011-05-30 DIAGNOSIS — R51 Headache: Secondary | ICD-10-CM

## 2011-05-30 NOTE — Patient Instructions (Addendum)
  Increase fluid intake Use meclizine as needed for vertigo/dizziness Try claritin or zyrtec for allergies, which may be contributing to symptoms.  Avoid decongestants. ?accuracy of your BP machine--usually if bottom number is so high, the top number is higher than yours was  Try checking BP at pharmacy.  Low sodium diet.  If BP's are persistently elevated, then return for further discussion.  Normal BP's are <130/80, borderline if 140/90.  If >140/90 (either number) on a regular basis, then medications are recommended  Vertigo Vertigo means you feel like you or your surroundings are moving when they are not. Vertigo can be dangerous if it occurs when you are at work, driving, or performing difficult activities.  CAUSES  Vertigo occurs when there is a conflict of signals sent to your brain from the visual and sensory systems in your body. There are many different causes of vertigo, including:  Infections, especially in the inner ear.   A bad reaction to a drug or misuse of alcohol and medicines.   Withdrawal from drugs or alcohol.   Rapidly changing positions, such as lying down or rolling over in bed.   A migraine headache.   Decreased blood flow to the brain.   Increased pressure in the brain from a head injury, infection, tumor, or bleeding.  SYMPTOMS  You may feel as though the world is spinning around or you are falling to the ground. Because your balance is upset, vertigo can cause nausea and vomiting. You may have involuntary eye movements (nystagmus). DIAGNOSIS  Vertigo is usually diagnosed by physical exam. If the cause of your vertigo is unknown, your caregiver may perform imaging tests, such as an MRI scan (magnetic resonance imaging). TREATMENT  Most cases of vertigo resolve on their own, without treatment. Depending on the cause, your caregiver may prescribe certain medicines. If your vertigo is related to body position issues, your caregiver may recommend movements or  procedures to correct the problem. In rare cases, if your vertigo is caused by certain inner ear problems, you may need surgery. HOME CARE INSTRUCTIONS   Follow your caregiver's instructions.   Avoid driving.   Avoid operating heavy machinery.   Avoid performing any tasks that would be dangerous to you or others during a vertigo episode.   Tell your caregiver if you notice that certain medicines seem to be causing your vertigo. Some of the medicines used to treat vertigo episodes can actually make them worse in some people.  SEEK IMMEDIATE MEDICAL CARE IF:   Your medicines do not relieve your vertigo or are making it worse.   You develop problems with talking, walking, weakness, or using your arms, hands, or legs.   You develop severe headaches.   Your nausea or vomiting continues or gets worse.   You develop visual changes.   A family member notices behavioral changes.   Your condition gets worse.  MAKE SURE YOU:  Understand these instructions.   Will watch your condition.   Will get help right away if you are not doing well or get worse.  Document Released: 11/21/2004 Document Revised: 01/31/2011 Document Reviewed: 08/30/2010 [facility] Patient Information 2012 [place], [place].
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
## 2011-07-15 ENCOUNTER — Telehealth: Payer: Self-pay | Admitting: Family Medicine

## 2011-07-15 MED ORDER — FLUTICASONE PROPIONATE 50 MCG/ACT NA SUSP: 2.0000 | Freq: Every day | NASAL | Status: DC

## 2011-07-15 NOTE — Telephone Encounter (Signed)
Called pt to verify what med she wanted to refill. She states flonase. Filled flonase 16g with 3 refills to [facility] on [address]

## 2011-07-15 NOTE — Telephone Encounter (Signed)
Wants Rx refill on allergy meds, flonase     [facility] [address]

## 2011-07-15 NOTE — Telephone Encounter (Signed)
Chart reviewed--mainly on asthma meds.  Is on Singulair for both. We may have talked about taking Flonase when she was here last month.  Please confirm which med she needs refill on (Singulair, vs needing a new rx for Flonase). If wants to start Flonase, okay for #1 with 3 refills.  If needs singulair, okay for #30 with 3 refills

## 2011-08-08 ENCOUNTER — Telehealth: Payer: Self-pay | Admitting: Family Medicine

## 2011-08-08 DIAGNOSIS — J45901 Unspecified asthma with (acute) exacerbation: Secondary | ICD-10-CM

## 2011-08-08 MED ORDER — ALBUTEROL SULFATE HFA 108 (90 BASE) MCG/ACT IN AERS: 2.0000 | INHALATION_SPRAY | RESPIRATORY_TRACT | Status: DC | PRN

## 2011-08-08 NOTE — Telephone Encounter (Signed)
Done

## 2011-08-21 ENCOUNTER — Encounter: Payer: Self-pay | Admitting: Family Medicine

## 2011-08-21 ENCOUNTER — Ambulatory Visit (INDEPENDENT_AMBULATORY_CARE_PROVIDER_SITE_OTHER): Admitting: Family Medicine

## 2011-08-21 VITALS — BP 132/98 | HR 80 | Temp 98.4°F | Ht 65.0 in | Wt 223.0 lb

## 2011-08-21 DIAGNOSIS — M542 Cervicalgia: Secondary | ICD-10-CM

## 2011-08-21 DIAGNOSIS — J309 Allergic rhinitis, unspecified: Secondary | ICD-10-CM

## 2011-08-21 DIAGNOSIS — F17200 Nicotine dependence, unspecified, uncomplicated: Secondary | ICD-10-CM

## 2011-08-21 DIAGNOSIS — J45901 Unspecified asthma with (acute) exacerbation: Secondary | ICD-10-CM

## 2011-08-21 MED ORDER — MONTELUKAST SODIUM 10 MG PO TABS: 10.0000 mg | ORAL_TABLET | Freq: Every day | ORAL | Status: DC

## 2011-08-21 MED ORDER — FLUTICASONE PROPIONATE 50 MCG/ACT NA SUSP: 2.0000 | Freq: Every day | NASAL | Status: DC

## 2011-08-21 NOTE — Patient Instructions (Addendum)
QUIT smoking entirely. Restart Singulair every day, as asthma is poorly controlled, needing daily rescue inhaler.  If after 2 weeks of being back on the singulair, still needing albuterol more than 2-3 times per week, then needs to follow up to discuss other preventative asthma medications.  Allergies--refill Flonase,  Okay to continue Zyrtec if needed.  I'm unsure exactly what is causing your tongue and neck discomfort--but the areas of discomfort suggest etiology related to salivary glands.  Trial of sucking on lemon drops.  Return if having fevers, swelling, increased pain

## 2011-08-21 NOTE — Progress Notes (Signed)
Chief Complaint  Patient presents with  . Adenopathy    swollen lymph nodes both sides of her neck. Also under her tongue feels numb and food have been tasting spicier than ususal x 3-4 days. Needs refill on Flonase.   HPI: Noticed swollen glands on both sides of the neck, under the chin, about 3 days ago.  Denies any URI symptoms, sore throat, runny nose, cough.  Denies fevers.  Feels slightly numb/funny underneath her tongue.  Denies change in diet. Cut back a whole lot on smoking, only an occasional cigarette.  Allergies:  Requesting refill on Flonase. Allergies have been controlled with zyrtec for the most part, but would like to restart Flonase.  Asthma--Admits to not taking her Singulair, and has been needing her albuterol at least once or twice daily.  Past Medical History  Diagnosis Date  . Asthma   . Tobacco use disorder   . Genital herpes    Past Surgical History  Procedure Date  . Im nailing humerus 2001    ORIF  . Cervical fusion 2001   History   Social History  . Marital Status: Married    Spouse Name: N/A    Number of Children: N/A  . Years of Education: N/A   Occupational History  . Not on file.   Social History Main Topics  . Smoking status: Current Some Day Smoker -- 0.0 packs/day    Types: Cigarettes    Last Attempt to Quit: 08/15/2010  . Smokeless tobacco: Never Used   Comment: smokes when stressed  . Alcohol Use: Yes     1-2 drinks on weekends  . Drug Use: No  . Sexually Active: Not on file   Other Topics Concern  . Not on file   Social History Narrative  . No narrative on file   Current Outpatient Prescriptions on File Prior to Visit  Medication Sig Dispense Refill  . acyclovir (ZOVIRAX) 400 MG tablet Take 1 tablet (400 mg total) by mouth 2 (two) times daily.  60 tablet  5  . albuterol (PROVENTIL HFA;VENTOLIN HFA) 108 (90 BASE) MCG/ACT inhaler Inhale 2 puffs into the lungs every 4 (four) hours as needed for wheezing or shortness of breath.   1 Inhaler  0  . aspirin-acetaminophen-caffeine (EXCEDRIN MIGRAINE) 250-250-65 MG per tablet Take 2 tablets by mouth every 6 (six) hours as needed. For pain      . naproxen sodium (ANAPROX) 220 MG tablet Take 660 mg by mouth 2 (two) times daily with a meal.      . Prenatal Vit-Fe Fumarate-FA (PRENATAL MULTIVITAMIN) 60-1 MG tablet Take 1 tablet by mouth daily.       Marland Kitchen DISCONTD: fluticasone (FLONASE) 50 MCG/ACT nasal spray Place 2 sprays into the nose daily.  16 g  3  . DISCONTD: montelukast (SINGULAIR) 10 MG tablet Take 1 tablet (10 mg total) by mouth at bedtime.  30 tablet  5   No Known Allergies  ROS:  Denies fevers, sore throat, cough, chest pain, GI complaints, skin rash.  +wheezing (not currently)  PHYSICAL EXAM: BP 132/98  Pulse 80  Temp 98.4 F (36.9 C) (Oral)  Ht 5\' 5"  (1.651 m)  Wt 223 lb (101.152 kg)  BMI 37.11 kg/m2  LMP 08/16/2011 Well developed, pleasant obese female in no distress HEENT:  PERRL, EOMI, conjunctiva clear.  TM's and EAC's normal.  OP clear. Sinuses nontender. Neck: no lymphadenopathy or mass. Area of discomfort is submandibular.  Area of discomfort on tongue is at base  of tongue--appears normal.  Lungs: clear bilaterally with good air movement and no wheezes Heart: regular rate and rhythm without murmur Skin: no rash Psych: normal mood, affect, hygiene and grooming  ASSESSMENT/PLAN: 1. Anterior neck pain    2. Smoker    3. Asthma flare  montelukast (SINGULAIR) 10 MG tablet  4. Allergic rhinitis, cause unspecified  fluticasone (FLONASE) 50 MCG/ACT nasal spray  5. Asthma exacerbation     Tongue and neck (under chin) complaints--normal exam. Given location of symptoms, suspect possible salivary gland etiology.  Recommend trial of lemon drops.  No evidence of obstruction or infection at this time.  Encouraged to QUIT smoking entirely--counseled re: risks of smoking and available assistance in quitting.  Asthma: Restart Singulair every day, as asthma is  poorly controlled, needing daily rescue inhaler.  If after 2 weeks of being back on the singulair, still needing albuterol more than 2-3 times per week, then needs to follow up to discuss other preventative asthma medications.  Allergies--refill Flonase,  Okay to continue Zyrtec if needed.

## 2011-10-29 ENCOUNTER — Other Ambulatory Visit: Payer: Self-pay | Admitting: Family Medicine

## 2011-10-29 DIAGNOSIS — J45901 Unspecified asthma with (acute) exacerbation: Secondary | ICD-10-CM

## 2011-10-29 MED ORDER — ALBUTEROL SULFATE HFA 108 (90 BASE) MCG/ACT IN AERS: 2.0000 | INHALATION_SPRAY | RESPIRATORY_TRACT | Status: DC | PRN

## 2011-10-29 NOTE — Telephone Encounter (Signed)
Needs refill on Proventil inhaler

## 2012-01-27 ENCOUNTER — Telehealth: Payer: Self-pay | Admitting: Family Medicine

## 2012-01-27 DIAGNOSIS — J45901 Unspecified asthma with (acute) exacerbation: Secondary | ICD-10-CM

## 2012-01-27 MED ORDER — ALBUTEROL SULFATE HFA 108 (90 BASE) MCG/ACT IN AERS: 2.0000 | INHALATION_SPRAY | RESPIRATORY_TRACT | Status: DC | PRN

## 2012-01-27 NOTE — Telephone Encounter (Signed)
Pt scheduled for 02/13/12.

## 2012-01-27 NOTE — Telephone Encounter (Signed)
Okay for refill--I sent it to pharmacy.  She needs to schedule for a med check this month (last visit in June, given 6 months of singulair)

## 2012-01-27 NOTE — Telephone Encounter (Signed)
PT STATES SHE HAS BEEN USING SINGULAR BUT NEEDS ALBUTERAL RESCUE INHALER CALLED INTO [facility] [address]. CALL PT WHEN DONE.

## 2012-02-13 ENCOUNTER — Ambulatory Visit: Admitting: Family Medicine

## 2012-02-20 ENCOUNTER — Encounter: Payer: Self-pay | Admitting: Internal Medicine

## 2012-03-04 ENCOUNTER — Ambulatory Visit: Admitting: Family Medicine

## 2012-03-09 ENCOUNTER — Ambulatory Visit (INDEPENDENT_AMBULATORY_CARE_PROVIDER_SITE_OTHER): Admitting: Family Medicine

## 2012-03-09 ENCOUNTER — Encounter: Payer: Self-pay | Admitting: Family Medicine

## 2012-03-09 VITALS — BP 120/88 | HR 72 | Ht 65.0 in | Wt 221.0 lb

## 2012-03-09 DIAGNOSIS — J45901 Unspecified asthma with (acute) exacerbation: Secondary | ICD-10-CM

## 2012-03-09 DIAGNOSIS — F17200 Nicotine dependence, unspecified, uncomplicated: Secondary | ICD-10-CM

## 2012-03-09 DIAGNOSIS — A6000 Herpesviral infection of urogenital system, unspecified: Secondary | ICD-10-CM

## 2012-03-09 DIAGNOSIS — A6009 Herpesviral infection of other urogenital tract: Secondary | ICD-10-CM

## 2012-03-09 MED ORDER — ACYCLOVIR 400 MG PO TABS: 400.0000 mg | ORAL_TABLET | Freq: Three times a day (TID) | ORAL | Status: DC

## 2012-03-09 MED ORDER — MONTELUKAST SODIUM 10 MG PO TABS: 10.0000 mg | ORAL_TABLET | Freq: Every day | ORAL | Status: DC

## 2012-03-09 NOTE — Patient Instructions (Signed)
Continue Singulair.  Take omeprazole daily, prior to dinner.  (may use twice daily as needed).  Reflux diet and precautions reviewed--see handout.  If your heartburn has improved, but you continue to need albuterol more than 2x/week, we likely need to add an inhaled steroid--please call and let us know that you are having ongoing asthma issues.  Remember to add back Flonase and/or zyrtec/claritin when allergy season starts.  Please QUIT SMOKING!  Diet for Gastroesophageal Reflux Disease, Adult Reflux (acid reflux) is when acid from your stomach flows up into the esophagus. When acid comes in contact with the esophagus, the acid causes irritation and soreness (inflammation) in the esophagus. When reflux happens often or so severely that it causes damage to the esophagus, it is called gastroesophageal reflux disease (GERD). Nutrition therapy can help ease the discomfort of GERD. FOODS OR DRINKS TO AVOID OR LIMIT  Smoking or chewing tobacco. Nicotine is one of the most potent stimulants to acid production in the gastrointestinal tract.  Caffeinated and decaffeinated coffee and black tea.  Regular or low-calorie carbonated beverages or energy drinks (caffeine-free carbonated beverages are allowed).   Strong spices, such as black pepper, white pepper, red pepper, cayenne, curry powder, and chili powder.  Peppermint or spearmint.  Chocolate.  High-fat foods, including meats and fried foods. Extra added fats including oils, butter, salad dressings, and nuts. Limit these to less than 8 tsp per day.  Fruits and vegetables if they are not tolerated, such as citrus fruits or tomatoes.  Alcohol.  Any food that seems to aggravate your condition. If you have questions regarding your diet, call your caregiver or a registered dietitian. OTHER THINGS THAT MAY HELP GERD INCLUDE:   Eating your meals slowly, in a relaxed setting.  Eating 5 to 6 small meals per day instead of 3 large  meals.  Eliminating food for a period of time if it causes distress.  Not lying down until 3 hours after eating a meal.  Keeping the head of your bed raised 6 to 9 inches (15 to 23 cm) by using a foam wedge or blocks under the legs of the bed. Lying flat may make symptoms worse.  Being physically active. Weight loss may be helpful in reducing reflux in overweight or obese adults.  Wear loose fitting clothing EXAMPLE MEAL PLAN This meal plan is approximately 2,000 calories based on https://www.bern[facility].org/ meal planning guidelines. Breakfast   cup cooked oatmeal.  1 cup strawberries.  1 cup low-fat milk.  1 oz almonds. Snack  1 cup cucumber slices.  6 oz yogurt (made from low-fat or fat-free milk). Lunch  2 slice whole-wheat bread.  2 oz sliced [place].  2 tsp mayonnaise.  1 cup blueberries.  1 cup snap peas. Snack  6 whole-wheat crackers.  1 oz string cheese. Dinner   cup brown rice.  1 cup mixed veggies.  1 tsp olive oil.  3 oz grilled fish. Document Released: 02/11/2005 Document Revised: 05/06/2011 Document Reviewed: 12/28/2010 [facility] Patient Information 2013 [place], [place].

## 2012-03-09 NOTE — Progress Notes (Signed)
Chief Complaint  Patient presents with  . Asthma    med check for asthma meds.   HPI:  Asthma:  Using Singulair daily.  It seems to be working well, but needs to use albuterol about 3 times/week.  Usually when she starts working in the morning, has a tightness in the chest, which responds to albuterol.  Sometimes also has symptoms before lying down in the evenings.  She has been having heartburn, and needing to take Tums.  She has been needing to take omeprazole about every other day, due to symptoms.  Denies any changes in her diet.  She cut out caffeine.  +OJ daily, 2 glasses daily.  Spaghetti and lasagna triggers her reflux, she loves it and eats pasta a few times/week  Smokes when stressed.  Herpes outbreak last week.  Gets infrequently.  Needs refill. Seasonal allergies--usually in the spring and fall, not bothering her now.  Past Medical History  Diagnosis Date  . Asthma   . Tobacco use disorder   . Genital herpes   . Smoker    Past Surgical History  Procedure Date  . Im nailing humerus 2001    ORIF  . Cervical fusion 2001   History   Social History  . Marital Status: Married    Spouse Name: N/A    Number of Children: N/A  . Years of Education: N/A   Occupational History  . Not on file.   Social History Main Topics  . Smoking status: Current Some Day Smoker -- 0.0 packs/day    Types: Cigarettes    Last Attempt to Quit: 08/15/2010  . Smokeless tobacco: Never Used     Comment: smokes when stressed  . Alcohol Use: Yes     Comment: 1-2 drinks on weekends  . Drug Use: No  . Sexually Active: Not on file   Other Topics Concern  . Not on file   Social History Narrative  . No narrative on file    Current outpatient prescriptions:acyclovir (ZOVIRAX) 400 MG tablet, Take 1 tablet (400 mg total) by mouth 2 (two) times daily., Disp: 60 tablet, Rfl: 5;  montelukast (SINGULAIR) 10 MG tablet, Take 1 tablet (10 mg total) by mouth at bedtime., Disp: 30 tablet, Rfl: 5;   Prenatal Vit-Fe Fumarate-FA (PRENATAL MULTIVITAMIN) 60-1 MG tablet, Take 1 tablet by mouth daily. , Disp: , Rfl:  albuterol (PROVENTIL HFA;VENTOLIN HFA) 108 (90 BASE) MCG/ACT inhaler, Inhale 2 puffs into the lungs every 4 (four) hours as needed for wheezing or shortness of breath., Disp: 1 Inhaler, Rfl: 0;  naproxen sodium (ANAPROX) 220 MG tablet, Take 660 mg by mouth 2 (two) times daily with a meal., Disp: , Rfl:   No Known Allergies  ROS:  Denies fevers, URI symptoms, cough, shortness of breath, headaches, dizziness, nausea, vomiting, diarrhea or other bowel changes, urinary complaints, edema, skin rash, bleeding/bruising, depression/anxiety, or other concerns.  PHYSICAL EXAM: BP 120/88  Pulse 72  Ht 5\' 5"  (1.651 m)  Wt 221 lb (100.245 kg)  BMI 36.78 kg/m2  LMP 02/28/2012 Well developed, pleasant female in no distress HEENT:  PERRL, EOMI, conjunctiva clear.  OP clear, TM's and EAC's normal.   Neck: no lymphadenopathy, thyromegaly or mass Heart: regular rate and rhythm without murmur Lungs: clear bilaterally.  No wheezes, rales, ronchi Abdomen: soft, nontender, no organomegaly or mass.  nontender in epigastrium Chest: wall is nontender Extremities: no edema, 2+ pulse Psych: normal mood, affect, hygiene and grooming  ASSESSMENT/PLAN:  Asthma--suboptimally controlled, likely related to  worsening reflux.  Continue Singulair.  Take omeprazole daily, prior to dinner.  (may use twice daily as needed).  Reflux diet and precautions reviewed--see handout.  Allergies--quiescent now.  Reminded to add back Flonase and/or zyrtec/claritin when allergy season starts.   Encouraged to QUIT smoking entirely--counseled re: risks of smoking and available assistance in quitting.  HSV--refill acyclovir, instructed on proper use (had only been taking BID--recommended TID use x 5 days prn flare.  F/u 6 months, sooner prn

## 2012-04-08 ENCOUNTER — Telehealth: Payer: Self-pay | Admitting: Family Medicine

## 2012-04-08 NOTE — Telephone Encounter (Signed)
Pt called and stated that the albuterol was not working the way she wanted it to. She stated that at last appointment you stated that if it didn't work you would try something else. She would like to do that. Please call pt and inform. Pt uses [facility] on [address].

## 2012-04-08 NOTE — Telephone Encounter (Signed)
This is what was discussed at visit: Asthma--suboptimally controlled, likely related to worsening reflux. Continue Singulair. Take omeprazole daily, prior to dinner. (may use twice daily as needed). Reflux diet and precautions reviewed  We did not discuss other medications at visit (just adequately treating her reflux).  She was asked to return sooner than 6 month appt if symptoms worsening.  Therefore, it is recommended she schedule OV, as a new medication will likely be started, and needs to be discussed (how to properly use, side effects, etc). Also, she needs to quit smoking to help with her breathing.

## 2012-04-08 NOTE — Telephone Encounter (Signed)
Called patient and left message for patient to return my call. 

## 2012-04-13 ENCOUNTER — Telehealth: Payer: Self-pay | Admitting: *Deleted

## 2012-04-13 DIAGNOSIS — J45901 Unspecified asthma with (acute) exacerbation: Secondary | ICD-10-CM

## 2012-04-13 MED ORDER — MONTELUKAST SODIUM 10 MG PO TABS: 10.0000 mg | ORAL_TABLET | Freq: Every day | ORAL | Status: DC

## 2012-04-13 NOTE — Telephone Encounter (Signed)
Spoke with patient and she would like to discuss other medications. Scheduled her for appt with Dr.Knapp 04/20/12 and refilled her singulair x 1 month.

## 2012-04-20 ENCOUNTER — Ambulatory Visit: Admitting: Family Medicine

## 2012-04-24 ENCOUNTER — Telehealth: Payer: Self-pay | Admitting: Family Medicine

## 2012-04-24 DIAGNOSIS — J45901 Unspecified asthma with (acute) exacerbation: Secondary | ICD-10-CM

## 2012-04-24 MED ORDER — ALBUTEROL SULFATE HFA 108 (90 BASE) MCG/ACT IN AERS: 2.0000 | INHALATION_SPRAY | RESPIRATORY_TRACT | Status: DC | PRN

## 2012-04-24 NOTE — Telephone Encounter (Signed)
done

## 2012-06-23 ENCOUNTER — Telehealth: Payer: Self-pay | Admitting: Family Medicine

## 2012-06-23 NOTE — Telephone Encounter (Signed)
Review of records doesn't show she is on this medication.  Review shows she didn't return phone call from Feb when she was asked to schedule OV for poorly controlled asthma.  Needs OV

## 2012-06-23 NOTE — Telephone Encounter (Signed)
Needs refill on Advair 250/50    [facility] [address]

## 2012-06-24 ENCOUNTER — Telehealth: Payer: Self-pay | Admitting: *Deleted

## 2012-06-24 NOTE — Telephone Encounter (Signed)
Left message for patient to call office and schedule appointment. I had scheduled her for appt 04/20/12, she no showed. Just an FYI.

## 2012-06-25 ENCOUNTER — Ambulatory Visit (INDEPENDENT_AMBULATORY_CARE_PROVIDER_SITE_OTHER): Admitting: Family Medicine

## 2012-06-25 ENCOUNTER — Encounter: Payer: Self-pay | Admitting: Family Medicine

## 2012-06-25 VITALS — BP 116/76 | HR 72 | Ht 65.0 in | Wt 219.0 lb

## 2012-06-25 DIAGNOSIS — J309 Allergic rhinitis, unspecified: Secondary | ICD-10-CM

## 2012-06-25 DIAGNOSIS — J45901 Unspecified asthma with (acute) exacerbation: Secondary | ICD-10-CM

## 2012-06-25 MED ORDER — ALBUTEROL SULFATE HFA 108 (90 BASE) MCG/ACT IN AERS: 2.0000 | INHALATION_SPRAY | RESPIRATORY_TRACT | Status: DC | PRN

## 2012-06-25 MED ORDER — MONTELUKAST SODIUM 10 MG PO TABS: 10.0000 mg | ORAL_TABLET | Freq: Every day | ORAL | Status: DC

## 2012-06-25 MED ORDER — FLUTICASONE-SALMETEROL 250-50 MCG/DOSE IN AEPB: 1.0000 | INHALATION_SPRAY | Freq: Two times a day (BID) | RESPIRATORY_TRACT | Status: DC

## 2012-06-25 NOTE — Progress Notes (Signed)
Chief Complaint  Patient presents with  . Asthma    would like to go back on Advair or possibly Symbicort.    She presents with asthma exacerbation.  She wakes up with tightness and cough.  Gets some relief by inhaler, but then with activity she has increasing shortness of breath.  Using rescue inhaler daily since the end of March, when allergies started flaring.  She quit smoking in March.  She ran out of her rescue inhaler on Friday--she needed to use it about 6x/day. Had been using it 2-3x/day quite regularly for months.  She has been using Singulair daily, although admits that she has only been using it regularly for the last 3 weeks.  She does think that it has been helping some, as she has been out of her albuterol for about 5 days, and she has been okay.  She only had one episode at work where she needed to do some breathing exercises until cough/SOB passed..  She has been using OTC Nasacort, which is helping with her sneezing.  Been taking it for about 2 weeks.  She previously took Advair back in 2012.  Stopped this medication mainly due to cost. She now is able to get med for free through the [facility], and gets through mail order.  Asking to restart Advair.  GERD:  She has been taking Prilosec OTC every day, and she has not had any reflux symptoms, controlling it well.  Trying for pregnancy.  Past Medical History  Diagnosis Date  . Asthma   . Tobacco use disorder   . Genital herpes   . Smoker    Past Surgical History  Procedure Laterality Date  . Im nailing humerus  2001    ORIF  . Cervical fusion  2001   History   Social History  . Marital Status: Married    Spouse Name: N/A    Number of Children: N/A  . Years of Education: N/A   Occupational History  . Not on file.   Social History Main Topics  . Smoking status: Former Smoker -- 0.00 packs/day    Types: Cigarettes    Quit date: 05/26/2012  . Smokeless tobacco: Never Used     Comment: smokes when stressed  .  Alcohol Use: Yes     Comment: 1-2 drinks on weekends  . Drug Use: No  . Sexually Active: Yes -- Female partner(s)    Birth Control/ Protection: None   Other Topics Concern  . Not on file   Social History Narrative  . No narrative on file   Current Outpatient Prescriptions on File Prior to Visit  Medication Sig Dispense Refill  . montelukast (SINGULAIR) 10 MG tablet Take 1 tablet (10 mg total) by mouth at bedtime.  30 tablet  0  . Prenatal Vit-Fe Fumarate-FA (PRENATAL MULTIVITAMIN) 60-1 MG tablet Take 1 tablet by mouth daily.       Marland Kitchen acyclovir (ZOVIRAX) 400 MG tablet Take 1 tablet (400 mg total) by mouth 3 (three) times daily. Take three times daily for 5 days for each outbreak  60 tablet  5  . albuterol (PROVENTIL HFA;VENTOLIN HFA) 108 (90 BASE) MCG/ACT inhaler Inhale 2 puffs into the lungs every 4 (four) hours as needed for wheezing or shortness of breath.  1 Inhaler  0  . naproxen sodium (ANAPROX) 220 MG tablet Take 660 mg by mouth 2 (two) times daily with a meal.       No current facility-administered medications on file prior  to visit.   No Known Allergies  ROS:  Denies headaches, dizziness, fevers, chest pain, heartburn, nausea, vomiting, skin rashes, or other problems, except per HPI.  PHYSICAL EXAM: BP 116/76  Pulse 72  Ht 5\' 5"  (1.651 m)  Wt 219 lb (99.338 kg)  BMI 36.44 kg/m2 Well developed, pleasant female, speaking easily in full sentences, in no distress HEENT:  PERRL, EOMI, conjunctiva clear.  OP clear.  Nasal mucosa moderately edematous, no erythema or purulence, sinuses nontender Neck: no lymphadenopathy or mass Heart: regular rate and rhythm without murmur Lungs: fair air movement, no ronchi or rales, so significant wheezing noted. Peak flow 295/305/270 (goal for her age/height is 448) Extremities: no clubbing, cyanosis or edema Skin: no rash Psych: normal mood, affect, hygiene and grooming  ASSESSMENT/PLAN:  Asthma exacerbation - Plan:  Fluticasone-Salmeterol (ADVAIR DISKUS) 250-50 MCG/DOSE AEPB  Asthma flare - Plan: montelukast (SINGULAIR) 10 MG tablet, albuterol (PROVENTIL HFA;VENTOLIN HFA) 108 (90 BASE) MCG/ACT inhaler, DISCONTINUED: albuterol (PROVENTIL HFA;VENTOLIN HFA) 108 (90 BASE) MCG/ACT inhaler  Allergic rhinitis, cause unspecified  Sample of Advair 250/50 given, along with a savings card in case a local rx needs to be called in.  Rx sent to mail order.  Reviewed risks/side effects, reminded to rinse mouth after use.  Advised to continue Singulair through the rest of allergy season, along with the nasacort.  Okay to stop the singulair after allergy season is over only if you aren't needing rescue inhaler.  If still needing albuterol at least 1-2x/week, then do NOT stop the singulair.  Albuterol--sent locally, as well as to mail order.  F/u in July (7/14) as scheduled, sooner if needed.

## 2012-06-25 NOTE — Patient Instructions (Signed)
Restart Advair--use it twice daily and rinse mouth out after using.  continue Singulair through the rest of allergy season, along with the nasacort.  Okay to stop the singulair after allergy season is over only if you aren't needing rescue inhaler.  If still needing albuterol at least 1-2x/week, then do NOT stop the singulair.  Return in 2-3 months, sooner prn.

## 2012-09-07 ENCOUNTER — Encounter: Admitting: Family Medicine

## 2012-11-25 ENCOUNTER — Telehealth: Payer: Self-pay | Admitting: Family Medicine

## 2012-11-25 DIAGNOSIS — A6000 Herpesviral infection of urogenital system, unspecified: Secondary | ICD-10-CM

## 2012-11-25 MED ORDER — ACYCLOVIR 400 MG PO TABS: 400.0000 mg | ORAL_TABLET | Freq: Three times a day (TID) | ORAL | Status: DC

## 2012-11-25 NOTE — Telephone Encounter (Signed)
Pt had bottle that said no more refills. i have sent in #60 0 refills to [facility] in [place]

## 2012-11-25 NOTE — Telephone Encounter (Signed)
She was rx'd #60 with 5 refills back in January.  If she hasn't used these refills, they can transfer (she also uses [facility] here).  If not able, okay to call a refill (no additional refills)

## 2012-12-31 ENCOUNTER — Telehealth: Payer: Self-pay | Admitting: Family Medicine

## 2012-12-31 ENCOUNTER — Other Ambulatory Visit: Payer: Self-pay | Admitting: *Deleted

## 2012-12-31 DIAGNOSIS — J45901 Unspecified asthma with (acute) exacerbation: Secondary | ICD-10-CM

## 2012-12-31 MED ORDER — ALBUTEROL SULFATE HFA 108 (90 BASE) MCG/ACT IN AERS: 2.0000 | INHALATION_SPRAY | RESPIRATORY_TRACT | Status: DC | PRN

## 2012-12-31 NOTE — Telephone Encounter (Signed)
Refill sent and scheduled appt for pt on 01/18/13.

## 2012-12-31 NOTE — Telephone Encounter (Signed)
Ok to refill #1 only.  She canceled her July appt, and is now due for a 6 month med check.  Needs to schedule.

## 2013-01-18 ENCOUNTER — Encounter: Payer: Self-pay | Admitting: Family Medicine

## 2013-01-25 ENCOUNTER — Encounter: Payer: Self-pay | Admitting: Family Medicine

## 2013-02-12 ENCOUNTER — Encounter (HOSPITAL_COMMUNITY): Payer: Self-pay | Admitting: Pharmacist

## 2013-02-16 ENCOUNTER — Other Ambulatory Visit: Payer: Self-pay | Admitting: Obstetrics & Gynecology

## 2013-02-23 ENCOUNTER — Encounter (HOSPITAL_COMMUNITY): Payer: Self-pay

## 2013-02-23 ENCOUNTER — Encounter (HOSPITAL_COMMUNITY)
Admission: RE | Admit: 2013-02-23 | Discharge: 2013-02-23 | Disposition: A | Discharge status: Home / Self Care | Source: Ambulatory Visit | Attending: Obstetrics & Gynecology | Admitting: Obstetrics & Gynecology

## 2013-02-23 DIAGNOSIS — Z01812 Encounter for preprocedural laboratory examination: Secondary | ICD-10-CM | POA: Insufficient documentation

## 2013-02-23 DIAGNOSIS — Z01818 Encounter for other preprocedural examination: Secondary | ICD-10-CM | POA: Insufficient documentation

## 2013-02-23 LAB — CBC
HCT: 40.4 % (ref 36.0–46.0)
Hemoglobin: 13.9 g/dL (ref 12.0–15.0)
MCH: 32 pg (ref 26.0–34.0)
MCHC: 34.4 g/dL (ref 30.0–36.0)
MCV: 93.1 fL (ref 78.0–100.0)
Platelets: 305 10*3/uL (ref 150–400)
RBC: 4.34 MIL/uL (ref 3.87–5.11)
RDW: 12.8 % (ref 11.5–15.5)
WBC: 8.2 10*3/uL (ref 4.0–10.5)

## 2013-02-23 NOTE — Patient Instructions (Signed)
20 Tonya Perkins  02/23/2013   Your procedure is scheduled on:  03/02/13  Enter through the Main Entrance of [facility] at 930 AM.  Pick up the phone at the desk and dial [phone].   Call this number if you have problems the morning of surgery: [phone]   Remember:   Do not eat food:After Midnight.  Do not drink clear liquids: After Midnight.  Take these medicines the morning of surgery with A SIP OF WATER: May take Prilosec, bring inhalers   Do not wear jewelry, make-up or nail polish.  Do not wear lotions, powders, or perfumes. You may wear deodorant.  Do not shave 48 hours prior to surgery.  Do not bring valuables to the hospital.  [facility] is not   responsible for any belongings or valuables brought to the hospital.  Contacts, dentures or bridgework may not be worn into surgery.  Leave suitcase in the car. After surgery it may be brought to your room.  For patients admitted to the hospital, checkout time is 11:00 AM the day of              discharge.   Patients discharged the day of surgery will not be allowed to drive             home.  Name and phone number of your driver: husband  Tonya Perkins  Special Instructions:   Shower using CHG 2 nights before surgery and the night before surgery.  If you shower the day of surgery use CHG.  Use special wash - you have one bottle of CHG for all showers.  You should use approximately 1/3 of the bottle for each shower.   Please read over the following fact sheets that you were given:   Surgical Site Infection Prevention

## 2013-03-02 ENCOUNTER — Ambulatory Visit (HOSPITAL_COMMUNITY): Admitting: Anesthesiology

## 2013-03-02 ENCOUNTER — Encounter (HOSPITAL_COMMUNITY): Admitting: Anesthesiology

## 2013-03-02 ENCOUNTER — Encounter (HOSPITAL_COMMUNITY)
Admission: RE | Disposition: A | Discharge status: Home / Self Care | Payer: Self-pay | Source: Ambulatory Visit | Attending: Obstetrics & Gynecology

## 2013-03-02 ENCOUNTER — Encounter (HOSPITAL_COMMUNITY): Payer: Self-pay | Admitting: *Deleted

## 2013-03-02 ENCOUNTER — Ambulatory Visit (HOSPITAL_COMMUNITY)
Admission: RE | Admit: 2013-03-02 | Discharge: 2013-03-02 | Disposition: A | Discharge status: Home / Self Care | Source: Ambulatory Visit | Attending: Obstetrics & Gynecology | Admitting: Obstetrics & Gynecology

## 2013-03-02 DIAGNOSIS — N840 Polyp of corpus uteri: Secondary | ICD-10-CM | POA: Insufficient documentation

## 2013-03-02 DIAGNOSIS — D250 Submucous leiomyoma of uterus: Secondary | ICD-10-CM | POA: Insufficient documentation

## 2013-03-02 DIAGNOSIS — Z87891 Personal history of nicotine dependence: Secondary | ICD-10-CM | POA: Insufficient documentation

## 2013-03-02 DIAGNOSIS — N921 Excessive and frequent menstruation with irregular cycle: Secondary | ICD-10-CM | POA: Insufficient documentation

## 2013-03-02 HISTORY — PX: DILATATION & CURRETTAGE/HYSTEROSCOPY WITH RESECTOCOPE: SHX5572

## 2013-03-02 LAB — PREGNANCY, URINE: Preg Test, Ur: NEGATIVE

## 2013-03-02 SURGERY — DILATATION & CURETTAGE/HYSTEROSCOPY WITH RESECTOCOPE
Anesthesia: General | Site: Cervix

## 2013-03-02 MED ORDER — KETOROLAC TROMETHAMINE 30 MG/ML IJ SOLN
INTRAMUSCULAR | Status: DC | PRN
  Administered 2013-03-02: 30 mg via INTRAVENOUS

## 2013-03-02 MED ORDER — ONDANSETRON HCL 4 MG/2ML IJ SOLN
INTRAMUSCULAR | Status: AC
  Filled 2013-03-02: qty 2

## 2013-03-02 MED ORDER — CHLOROPROCAINE HCL 1 % IJ SOLN
INTRAMUSCULAR | Status: DC | PRN
  Administered 2013-03-02: 20 mL

## 2013-03-02 MED ORDER — MIDAZOLAM HCL 2 MG/2ML IJ SOLN
INTRAMUSCULAR | Status: DC | PRN
  Administered 2013-03-02: 2 mg via INTRAVENOUS

## 2013-03-02 MED ORDER — DEXAMETHASONE SODIUM PHOSPHATE 10 MG/ML IJ SOLN
INTRAMUSCULAR | Status: DC | PRN
  Administered 2013-03-02: 10 mg via INTRAVENOUS

## 2013-03-02 MED ORDER — MEPERIDINE HCL 25 MG/ML IJ SOLN: 6.2500 mg | INTRAMUSCULAR | Status: DC | PRN

## 2013-03-02 MED ORDER — DEXAMETHASONE SODIUM PHOSPHATE 10 MG/ML IJ SOLN
INTRAMUSCULAR | Status: AC
  Filled 2013-03-02: qty 1

## 2013-03-02 MED ORDER — MIDAZOLAM HCL 2 MG/2ML IJ SOLN
INTRAMUSCULAR | Status: AC
  Filled 2013-03-02: qty 2

## 2013-03-02 MED ORDER — KETOROLAC TROMETHAMINE 30 MG/ML IJ SOLN
INTRAMUSCULAR | Status: AC
  Filled 2013-03-02: qty 1

## 2013-03-02 MED ORDER — FENTANYL CITRATE 0.05 MG/ML IJ SOLN
INTRAMUSCULAR | Status: AC
  Filled 2013-03-02: qty 2

## 2013-03-02 MED ORDER — FENTANYL CITRATE 0.05 MG/ML IJ SOLN
INTRAMUSCULAR | Status: DC | PRN
  Administered 2013-03-02 (×2): 50 ug via INTRAVENOUS

## 2013-03-02 MED ORDER — CEFAZOLIN SODIUM-DEXTROSE 2-3 GM-% IV SOLR
2.0000 g | INTRAVENOUS | Status: AC
  Administered 2013-03-02: 2 g via INTRAVENOUS

## 2013-03-02 MED ORDER — ONDANSETRON HCL 4 MG/2ML IJ SOLN
INTRAMUSCULAR | Status: DC | PRN
  Administered 2013-03-02: 4 mg via INTRAVENOUS

## 2013-03-02 MED ORDER — CHLOROPROCAINE HCL 1 % IJ SOLN
INTRAMUSCULAR | Status: AC
  Filled 2013-03-02: qty 30

## 2013-03-02 MED ORDER — GLYCOPYRROLATE 0.2 MG/ML IJ SOLN
INTRAMUSCULAR | Status: AC
  Filled 2013-03-02: qty 1

## 2013-03-02 MED ORDER — LACTATED RINGERS IV SOLN
INTRAVENOUS | Status: DC
  Administered 2013-03-02 (×2): via INTRAVENOUS

## 2013-03-02 MED ORDER — LIDOCAINE HCL (CARDIAC) 20 MG/ML IV SOLN
INTRAVENOUS | Status: AC
  Filled 2013-03-02: qty 5

## 2013-03-02 MED ORDER — PROMETHAZINE HCL 25 MG/ML IJ SOLN: 6.2500 mg | INTRAMUSCULAR | Status: DC | PRN

## 2013-03-02 MED ORDER — PROPOFOL 10 MG/ML IV BOLUS
INTRAVENOUS | Status: DC | PRN
  Administered 2013-03-02: 200 mg via INTRAVENOUS

## 2013-03-02 MED ORDER — MIDAZOLAM HCL 2 MG/2ML IJ SOLN: 0.5000 mg | Freq: Once | INTRAMUSCULAR | Status: DC | PRN

## 2013-03-02 MED ORDER — KETOROLAC TROMETHAMINE 30 MG/ML IJ SOLN: 15.0000 mg | Freq: Once | INTRAMUSCULAR | Status: DC | PRN

## 2013-03-02 MED ORDER — OXYCODONE-ACETAMINOPHEN 7.5-325 MG PO TABS: 1.0000 | ORAL_TABLET | ORAL | Status: DC | PRN

## 2013-03-02 MED ORDER — LIDOCAINE HCL (CARDIAC) 20 MG/ML IV SOLN
INTRAVENOUS | Status: DC | PRN
  Administered 2013-03-02: 80 mg via INTRAVENOUS

## 2013-03-02 MED ORDER — FENTANYL CITRATE 0.05 MG/ML IJ SOLN: 25.0000 ug | INTRAMUSCULAR | Status: DC | PRN

## 2013-03-02 MED ORDER — CEFAZOLIN SODIUM-DEXTROSE 2-3 GM-% IV SOLR
INTRAVENOUS | Status: AC
  Filled 2013-03-02: qty 50

## 2013-03-02 SURGICAL SUPPLY — 18 items
ABLATOR ENDOMETRIAL BIPOLAR (ABLATOR) IMPLANT
CANISTER SUCT 3000ML (MISCELLANEOUS) ×2 IMPLANT
CATH ROBINSON RED A/P 16FR (CATHETERS) ×2 IMPLANT
CLOTH BEACON ORANGE TIMEOUT ST (SAFETY) ×2 IMPLANT
CONTAINER PREFILL 10% NBF 60ML (FORM) ×4 IMPLANT
DRSG TELFA 3X8 NADH (GAUZE/BANDAGES/DRESSINGS) ×2 IMPLANT
ELECT REM PT RETURN 9FT ADLT (ELECTROSURGICAL) ×2
ELECTRODE REM PT RTRN 9FT ADLT (ELECTROSURGICAL) ×1 IMPLANT
GLOVE BIO SURGEON STRL SZ 6.5 (GLOVE) ×2 IMPLANT
GLOVE BIOGEL PI IND STRL 7.0 (GLOVE) ×1 IMPLANT
GLOVE BIOGEL PI INDICATOR 7.0 (GLOVE) ×1
GOWN STRL REIN XL XLG (GOWN DISPOSABLE) ×4 IMPLANT
LOOP ANGLED CUTTING 22FR (CUTTING LOOP) ×1 IMPLANT
PACK HYSTEROSCOPY LF (CUSTOM PROCEDURE TRAY) ×2 IMPLANT
PAD DRESSING TELFA 3X8 NADH (GAUZE/BANDAGES/DRESSINGS) ×1 IMPLANT
PAD OB MATERNITY 4.3X12.25 (PERSONAL CARE ITEMS) ×2 IMPLANT
TOWEL OR 17X24 6PK STRL BLUE (TOWEL DISPOSABLE) ×4 IMPLANT
WATER STERILE IRR 1000ML POUR (IV SOLUTION) ×2 IMPLANT

## 2013-03-02 NOTE — Discharge Summary (Signed)
  Physician Discharge Summary  Patient ID: Tonya Perkins MRN: [id number] DOB/AGE: [birth year] 33 y.o.  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 *   Discharged Condition: good  Hospital Course: Outpatient  Consults: None  Treatments: surgery: Hysteroscopy with resection and D+C  Disposition: 01-Home or Self Care     Medication List         acyclovir 400 MG tablet  Commonly known as:  ZOVIRAX  Take 1 tablet (400 mg total) by mouth 3 (three) times daily. Take three times daily for 5 days for each outbreak     albuterol 108 (90 BASE) MCG/ACT inhaler  Commonly known as:  PROVENTIL HFA;VENTOLIN HFA  Inhale 2 puffs into the lungs every 4 (four) hours as needed for wheezing or shortness of breath.     COMPLETE ALLERGY RELIEF 25 MG tablet  Generic drug:  diphenhydrAMINE  Take 25 mg by mouth every 6 (six) hours as needed.     Fluticasone-Salmeterol 250-50 MCG/DOSE Aepb  Commonly known as:  ADVAIR DISKUS  Inhale 1 puff into the lungs 2 (two) times daily.     montelukast 10 MG tablet  Commonly known as:  SINGULAIR  Take 1 tablet (10 mg total) by mouth at bedtime.     naproxen sodium 220 MG tablet  Commonly known as:  ANAPROX  Take 440 mg by mouth 2 (two) times daily with a meal.     omeprazole 20 MG capsule  Commonly known as:  PRILOSEC  Take 20 mg by mouth daily as needed (for Reflux).     oxyCODONE-acetaminophen 7.5-325 MG per tablet  Commonly known as:  PERCOCET  Take 1 tablet by mouth every 4 (four) hours as needed for pain.           Follow-up Information   Follow up with Caide Campi,MARIE-LYNE, MD In 3 weeks.   Specialty:  Obstetrics and Gynecology   Contact information:   [place] [address] [phone]       Signed: [name], MD 03/02/2013, 11:47 AM

## 2013-03-02 NOTE — H&P (Signed)
Tonya Perkins is an 33 y.o. female G0  RP:  [place] resection of SM myoma/D+C  Pertinent Gynecological History: Menses: flow is moderate Bleeding: intermenstrual bleeding Contraception: condoms Blood transfusions: none Sexually transmitted diseases: no past history Previous GYN Procedures: none  Last mammogram: N/A  Last pap: normal  OB History: G0   Menstrual History:  Patient's last menstrual period was 02/12/2013.    Past Medical History  Diagnosis Date  . Asthma   . Genital herpes   . Smoker     Past Surgical History  Procedure Laterality Date  . Im nailing humerus  2001    ORIF  . Cervical fusion  2001    Family History  Problem Relation Age of Onset  . Hypertension Mother   . Diabetes Mother   . Hypertension Father   . Cancer Father     prostate  . Asthma Brother     childhood    Social History:  reports that she quit smoking about 9 months ago. Her smoking use included Cigarettes. She smoked 0.00 packs per day. She has never used smokeless tobacco. She reports that she drinks alcohol. She reports that she does not use illicit drugs.  Allergies: No Known Allergies  Prescriptions prior to admission  Medication Sig Dispense Refill  . albuterol (PROVENTIL HFA;VENTOLIN HFA) 108 (90 BASE) MCG/ACT inhaler Inhale 2 puffs into the lungs every 4 (four) hours as needed for wheezing or shortness of breath.  1 Inhaler  0  . diphenhydrAMINE (COMPLETE ALLERGY RELIEF) 25 MG tablet Take 25 mg by mouth every 6 (six) hours as needed.      . Fluticasone-Salmeterol (ADVAIR DISKUS) 250-50 MCG/DOSE AEPB Inhale 1 puff into the lungs 2 (two) times daily.  180 each  1  . montelukast (SINGULAIR) 10 MG tablet Take 1 tablet (10 mg total) by mouth at bedtime.  90 tablet  1  . omeprazole (PRILOSEC) 20 MG capsule Take 20 mg by mouth daily as needed (for Reflux).      Marland Kitchen acyclovir (ZOVIRAX) 400 MG tablet Take 1 tablet (400 mg total) by mouth 3 (three) times daily. Take three times daily  for 5 days for each outbreak  60 tablet  0  . naproxen sodium (ANAPROX) 220 MG tablet Take 440 mg by mouth 2 (two) times daily with a meal.         ROS  Last menstrual period 02/12/2013. Physical Exam  Sonohysto:  IU lesion, prob. SM myoma 1.1 cm.  No results found for this or any previous visit (from the past 24 hour(s)).  No results found.  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  Surgery and risks reviewed.  Galilea Quito,MARIE-LYNE 03/02/2013, 9:19 AM

## 2013-03-02 NOTE — Transfer of Care (Signed)
Immediate Anesthesia Transfer of Care Note  Patient: Tonya Perkins  Procedure(s) Performed: Procedure(s) with comments: DILATATION & CURETTAGE/HYSTEROSCOPY WITH RESECTOCOPE (N/A) - removal of submucosal myoma  Patient Location: PACU  Anesthesia Type:General  Level of Consciousness: awake, alert , oriented and patient cooperative  Airway & Oxygen Therapy: Patient Spontanous Breathing and Patient connected to nasal cannula oxygen  Post-op Assessment: Report given to PACU RN and Post -op Vital signs reviewed and stable  Post vital signs: Reviewed and stable  Complications: No apparent anesthesia complications

## 2013-03-02 NOTE — Discharge Instructions (Addendum)
NO IBUPROFEN PRODUCTS (Advil, Aleve, Motrin, etc.) UNTIL AFTER 5:30pm TONIGHT   Hysteroscopy Hysteroscopy is a procedure used for looking inside the womb (uterus). It may be done for many different reasons, including:  To evaluate abnormal bleeding, fibroid (benign, noncancerous) tumors, polyps, scar tissue (adhesions), and possibly cancer of the uterus.  To look for lumps (tumors) and other uterine growths.  To look for causes of why a woman cannot get pregnant (infertility), causes of recurrent loss of pregnancy (miscarriages), or a lost intrauterine device (IUD).  To perform a sterilization by blocking the fallopian tubes from inside the uterus. A hysteroscopy should be done right after a menstrual period to be sure you are not pregnant. LET YOUR CAREGIVER KNOW ABOUT:   Allergies.  Medicines taken, including herbs, eyedrops, over-the-counter medicines, and creams.  Use of steroids (by mouth or creams).  Previous problems with anesthetics or numbing medicines.  History of bleeding or blood problems.  History of blood clots.  Possibility of pregnancy, if this applies.  Previous surgery.  Other health problems. RISKS AND COMPLICATIONS   Putting a hole in the uterus.  Excessive bleeding.  Infection.  Damage to the cervix.  Injury to other organs.  Allergic reaction to medicines.  Too much fluid used in the uterus for the procedure. BEFORE THE PROCEDURE   Do not take aspirin or blood thinners for a week before the procedure, or as directed. It can cause bleeding.  Arrive at least 60 minutes before the procedure or as directed to read and sign the necessary forms.  Arrange for someone to take you home after the procedure.  If you smoke, do not smoke for 2 weeks before the procedure. PROCEDURE   Your caregiver may give you medicine to relax you. He or she may also give you a medicine that numbs the area around the cervix (local anesthetic) or a medicine that  makes you sleep (general anesthesia).  Sometimes, a medicine is placed in the cervix the day before the procedure. This medicine makes the cervix have a larger opening (dilate). This makes it easier for the instrument to be inserted into the uterus.  A small instrument (hysteroscope) is inserted through the vagina into the uterus. This instrument is similar to a pencil-sized telescope with a light.  During the procedure, air or a liquid is put into the uterus, which allows the surgeon to see better.  Sometimes, tissue is gently scraped from inside the uterus. These tissue samples are sent to a specialist who looks at tissue samples (pathologist). The pathologist will give a report to your caregiver. This will help your caregiver decide if further treatment is necessary. The report will also help your caregiver decide on the best treatment if the test comes back abnormal. AFTER THE PROCEDURE   If you had a general anesthetic, you may be groggy for a couple hours after the procedure.  If you had a local anesthetic, you will be advised to rest at the surgical center or caregiver's office until you are stable and feel ready to go home.  You may have some cramping for a couple days.  You may have bleeding, which varies from light spotting for a few days to menstrual-like bleeding for up to 3 to 7 days. This is normal.  Have someone take you home. FINDING OUT THE RESULTS OF YOUR TEST Not all test results are available during your visit. If your test results are not back during the visit, make an appointment with your caregiver  to find out the results. Do not assume everything is normal if you have not heard from your caregiver or the medical facility. It is important for you to follow up on all of your test results. HOME CARE INSTRUCTIONS   Do not drive for 24 hours or as instructed.  Only take over-the-counter or prescription medicines for pain, discomfort, or fever as directed by your  caregiver.  Do not take aspirin. It can cause or aggravate bleeding.  Do not drive or drink alcohol while taking pain medicine.  You may resume your usual diet.  Do not use tampons, douche, or have sexual intercourse for 2 weeks, or as advised by your caregiver.  Rest and sleep for the first 24 to 48 hours.  Take your temperature twice a day for 4 to 5 days. Write it down. Give these temperatures to your caregiver if they are abnormal (above 98.6 F or 37.0 C).  Take medicines your caregiver has ordered as directed.  Follow your caregiver's advice regarding diet, exercise, lifting, driving, and general activities.  Take showers instead of baths for 2 weeks, or as recommended by your caregiver.  If you develop constipation:  Take a mild laxative with the advice of your caregiver.  Eat bran foods.  Drink enough water and fluids to keep your urine clear or pale yellow.  Try to have someone with you or available to you for the first 24 to 48 hours, especially if you had a general anesthetic.  Make sure you and your family understand everything about your operation and recovery.  Follow your caregiver's advice regarding follow-up appointments and Pap smears. SEEK MEDICAL CARE IF:   You feel dizzy or lightheaded.  You feel sick to your stomach (nauseous).  You develop abnormal vaginal discharge.  You develop a rash.  You have an abnormal reaction or allergy to your medicine.  You need stronger pain medicine. SEEK IMMEDIATE MEDICAL CARE IF:   Bleeding is heavier than a normal menstrual period or you have blood clots.  You have an oral temperature above 102 F (38.9 C), not controlled by medicine.  You have increasing cramps or pains not relieved with medicine.  You develop belly (abdominal) pain that does not seem to be related to the same area of earlier cramping and pain.  You pass out.  You develop pain in the tops of your shoulders (shoulder strap  areas).  You develop shortness of breath. MAKE SURE YOU:   Understand these instructions.  Will watch your condition.  Will get help right away if you are not doing well or get worse.   Document Released: 05/20/2000 Document Revised: 05/06/2011 Document Reviewed: 09/10/2012 [facility] Patient Information 2014 [place], [place].

## 2013-03-02 NOTE — Anesthesia Postprocedure Evaluation (Signed)
Anesthesia Post Note  Patient: Tonya Perkins  Procedure(s) Performed: Procedure(s) (LRB): DILATATION & CURETTAGE/HYSTEROSCOPY WITH RESECTOCOPE (N/A)  Anesthesia type: General  Patient location: PACU  Post pain: Pain level controlled  Post assessment: Post-op Vital signs reviewed  Last Vitals:  Filed Vitals:   03/02/13 1230  BP: 140/74  Pulse:   Temp:   Resp:     Post vital signs: Reviewed  Level of consciousness: sedated  Complications: No apparent anesthesia complications

## 2013-03-02 NOTE — Op Note (Signed)
03/02/2013  11:39 AM  PATIENT:  Tonya Perkins  33 y.o. female  PRE-OPERATIVE DIAGNOSIS:  Submucosal Myoma  POST-OPERATIVE DIAGNOSIS:  Submucosal Myoma, endometrial polyps  PROCEDURE:  Procedure(s): DILATATION & CURETTAGE/HYSTEROSCOPY WITH RESECTOCOPE  SURGEON:  Surgeon(s): [name], MD  ASSISTANTS: none   ANESTHESIA:   general  PROCEDURE:  Under general anesthesia with a laryngeal mask the patient is in lithotomy position. She is prepped with Betadine on the suprapubic, vulvar and vaginal areas. The bladder is catheterized. The vaginal exam reveals an anteverted uterus no adnexal mass. The speculum is inserted in the vagina and the anterior lip of the cervix is grasped with a tenaculum. A paracervical block is done with Nesacaine 1% a total of 20 cc at 4 and 8:00. Dilation of the cervix with Hegar dilators up to #29 without difficulty. Insertion of the resectoscope with the loop in the intrauterine cavity.  Small endometrial polyps are present on the posterior aspect of the uterus and a submucosal myoma is visualized anteriorly. All lesions are resected.  Coagulation mode is used to complete hemostasis.  We then removed the resectoscope and proceed with a systematic curettage of the intrauterine cavity on all surfaces with a sharp curet. All specimens are sent together to pathology.  The ostia are visualized. Pictures were taken before and after resection of the lesions.  All instruments are removed and silver nitrate is used on the cervix to complete hemostasis. The speculum is removed. The patient is brought to recovery room in good and stable status.  ESTIMATED BLOOD LOSS: 50 cc FLUID DEFICIT: 200 cc   Intake/Output Summary (Last 24 hours) at 03/02/13 1139 Last data filed at 03/02/13 1047  Gross per 24 hour  Intake    300 ml  Output     25 ml  Net    275 ml     BLOOD ADMINISTERED:none   LOCAL MEDICATIONS USED:  Nesacaine 1%, Amount: 20 ml  SPECIMEN:  Source of  Specimen:  Resections of submucosal myoma and endometrial polyps and endometrial curettings  DISPOSITION OF SPECIMEN:  PATHOLOGY  COUNTS:  YES  PLAN OF CARE: Transfer to PACU  [name] MD  03/02/2013 at 11:40 am

## 2013-03-02 NOTE — Anesthesia Preprocedure Evaluation (Addendum)
Anesthesia Evaluation  Patient identified by MRN, date of birth, ID band Patient awake    Reviewed: Allergy & Precautions, H&P , Patient's Chart, lab work & pertinent test results  Airway Mallampati: III TM Distance: >3 FB Neck ROM: full    Dental   Pulmonary asthma , former smoker,  breath sounds clear to auscultation        Cardiovascular Rhythm:regular Rate:Normal     Neuro/Psych    GI/Hepatic   Endo/Other    Renal/GU      Musculoskeletal   Abdominal   Peds  Hematology   Anesthesia Other Findings   Reproductive/Obstetrics                          Anesthesia Physical Anesthesia Plan  ASA: II  Anesthesia Plan: General LMA   Post-op Pain Management:    Induction:   Airway Management Planned:   Additional Equipment:   Intra-op Plan:   Post-operative Plan:   Informed Consent: I have reviewed the patients History and Physical, chart, labs and discussed the procedure including the risks, benefits and alternatives for the proposed anesthesia with the patient or authorized representative who has indicated his/her understanding and acceptance.     Plan Discussed with:   Anesthesia Plan Comments:        Anesthesia Quick Evaluation

## 2013-03-03 ENCOUNTER — Encounter (HOSPITAL_COMMUNITY): Payer: Self-pay | Admitting: Obstetrics & Gynecology

## 2013-03-25 ENCOUNTER — Telehealth: Payer: Self-pay | Admitting: *Deleted

## 2013-03-25 ENCOUNTER — Telehealth: Payer: Self-pay | Admitting: Family Medicine

## 2013-03-25 DIAGNOSIS — J45901 Unspecified asthma with (acute) exacerbation: Secondary | ICD-10-CM

## 2013-03-25 MED ORDER — ALBUTEROL SULFATE HFA 108 (90 BASE) MCG/ACT IN AERS: 2.0000 | INHALATION_SPRAY | RESPIRATORY_TRACT | Status: DC | PRN

## 2013-03-25 MED ORDER — FLUTICASONE-SALMETEROL 250-50 MCG/DOSE IN AEPB: 1.0000 | INHALATION_SPRAY | Freq: Two times a day (BID) | RESPIRATORY_TRACT | Status: DC

## 2013-03-25 NOTE — Telephone Encounter (Signed)
Sent rx's x #1 to [facility] in [place]. Patient states that she did move there but will continue coming here to see Dr.Knapp. She tried to get an appointment for tomorrow but Dr.Knapp is not here. She will check her schedule and call back and get an appt in the next 30 days.

## 2013-03-25 NOTE — Telephone Encounter (Signed)
Pt is long past due for f/u visit, has none scheduled.  Pharmacy is in [place]--did she move?  Refill #1 only.  Needs med check

## 2013-05-24 ENCOUNTER — Other Ambulatory Visit: Payer: Self-pay | Admitting: *Deleted

## 2013-05-24 ENCOUNTER — Telehealth: Payer: Self-pay | Admitting: Internal Medicine

## 2013-05-24 DIAGNOSIS — J45901 Unspecified asthma with (acute) exacerbation: Secondary | ICD-10-CM

## 2013-05-24 MED ORDER — ALBUTEROL SULFATE HFA 108 (90 BASE) MCG/ACT IN AERS
2.0000 | INHALATION_SPRAY | RESPIRATORY_TRACT | Status: DC | PRN
Start: 2013-05-24 — End: 2013-10-22

## 2013-05-24 NOTE — Telephone Encounter (Signed)
Pt is coming in Thursday for an appt but is out of her inhaler and needs her albuterol inhaler filled to [facility] [place]

## 2013-05-24 NOTE — Telephone Encounter (Signed)
Done for #1.

## 2013-05-24 NOTE — Telephone Encounter (Signed)
Dr.Knapp, This was sent to me, are you okay with this?

## 2013-05-24 NOTE — Telephone Encounter (Signed)
Tonya Perkins for #1 only.  She moved to [place], but has appt 4/2.  If she cancels appointment, this will no longer be refilled for her (--she will need to find a doctor in [place])

## 2013-05-27 ENCOUNTER — Ambulatory Visit (INDEPENDENT_AMBULATORY_CARE_PROVIDER_SITE_OTHER): Admitting: Family Medicine

## 2013-05-27 ENCOUNTER — Encounter: Payer: Self-pay | Admitting: Family Medicine

## 2013-05-27 VITALS — BP 118/84 | HR 76 | Ht 65.0 in | Wt 223.0 lb

## 2013-05-27 DIAGNOSIS — J45901 Unspecified asthma with (acute) exacerbation: Secondary | ICD-10-CM

## 2013-05-27 DIAGNOSIS — K219 Gastro-esophageal reflux disease without esophagitis: Secondary | ICD-10-CM

## 2013-05-27 DIAGNOSIS — J309 Allergic rhinitis, unspecified: Secondary | ICD-10-CM

## 2013-05-27 DIAGNOSIS — A6000 Herpesviral infection of urogenital system, unspecified: Secondary | ICD-10-CM

## 2013-05-27 MED ORDER — FLUTICASONE-SALMETEROL 250-50 MCG/DOSE IN AEPB: 1.0000 | INHALATION_SPRAY | Freq: Two times a day (BID) | RESPIRATORY_TRACT | Status: DC

## 2013-05-27 MED ORDER — ACYCLOVIR 400 MG PO TABS: 400.0000 mg | ORAL_TABLET | Freq: Three times a day (TID) | ORAL | Status: DC

## 2013-05-27 MED ORDER — MONTELUKAST SODIUM 10 MG PO TABS: 10.0000 mg | ORAL_TABLET | Freq: Every day | ORAL | Status: DC

## 2013-05-27 NOTE — Patient Instructions (Signed)
  Allergies--continue Singulair.  Add antihistamine such as claritin, zyrtec or allegra.  Consider restarting Nasacort (or Flonase) with proper technique--gentle sniffs, and aim slightly to the outer portion of the nose, not the central portion.  Asthma--suboptimally controlled due to being out of Advair.  Restart Advair--remember to rinse mouth after using it.  GERD--continue prilosec up to twice daily, if needed. Try and make some dietary changes (avoid citrus, acidic foods such as tomatoes, caffeine, etc), small frequent meals, and waiting at least 2 hours before lying down after eating), and you likely will be able to get back down to once daily dosing.  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  You must get flu shots every fall!  Diet for Gastroesophageal Reflux Disease, Adult Reflux (acid reflux) is when acid from your stomach flows up into the esophagus. When acid comes in contact with the esophagus, the acid causes irritation and soreness (inflammation) in the esophagus. When reflux happens often or so severely that it causes damage to the esophagus, it is called gastroesophageal reflux disease (GERD). Nutrition therapy can help ease the discomfort of GERD. FOODS OR DRINKS TO AVOID OR LIMIT  Smoking or chewing tobacco. Nicotine is one of the most potent stimulants to acid production in the gastrointestinal tract.  Caffeinated and decaffeinated coffee and black tea.  Regular or low-calorie carbonated beverages or energy drinks (caffeine-free carbonated beverages are allowed).   Strong spices, such as black pepper, white pepper, red pepper, cayenne, curry powder, and chili powder.  Peppermint or spearmint.  Chocolate.  High-fat foods, including meats and fried foods. Extra added fats including oils, butter, salad dressings, and nuts. Limit these to less  than 8 tsp per day.  Fruits and vegetables if they are not tolerated, such as citrus fruits or tomatoes.  Alcohol.  Any food that seems to aggravate your condition. If you have questions regarding your diet, call your caregiver or a registered dietitian. OTHER THINGS THAT MAY HELP GERD INCLUDE:   Eating your meals slowly, in a relaxed setting.  Eating 5 to 6 small meals per day instead of 3 large meals.  Eliminating food for a period of time if it causes distress.  Not lying down until 3 hours after eating a meal.  Keeping the head of your bed raised 6 to 9 inches (15 to 23 cm) by using a foam wedge or blocks under the legs of the bed. Lying flat may make symptoms worse.  Being physically active. Weight loss may be helpful in reducing reflux in overweight or obese adults.  Wear loose fitting clothing EXAMPLE MEAL PLAN This meal plan is approximately 2,000 calories based on [email or web address] meal planning guidelines. Breakfast   cup cooked oatmeal.  1 cup strawberries.  1 cup low-fat milk.  1 oz almonds. Snack  1 cup cucumber slices.  6 oz yogurt (made from low-fat or fat-free milk). Lunch  2 slice whole-wheat bread.  2 oz sliced [place].  2 tsp mayonnaise.  1 cup blueberries.  1 cup snap peas. Snack  6 whole-wheat crackers.  1 oz string cheese. Dinner   cup brown rice.  1 cup mixed veggies.  1 tsp olive oil.  3 oz grilled fish. Document Released: 02/11/2005 Document Revised: 05/06/2011 Document Reviewed: 12/28/2010 [facility] Patient Information 2014 [place], [place].

## 2013-05-27 NOTE — Progress Notes (Signed)
Chief Complaint  Patient presents with  . Asthma    med check.    Patient was last seen in this office 06/2012 with an asthma exacerbation.  She is in school in [place], which requires a lot of walking.  She is having shortness of breath walking to classes, requiring her to use her rescue inhaler. She has been using the albuterol 3 times daily since January.  She admits that she has been out of her Advair for quite some time. She was able to get it when she got it through [facility] by mail for 90 days, but since then hasn't gotten it--was too expensive to get from regular pharmacy ($75).  She tolerated the medicatiion well, without side effects, and it seemed to work well, when taken along with the Singulair.  She has been compliant with taking Singulair.  Allergies have recently started flaring.  She has tried nasacort in the past--she didn't like it.  Felt like it opened up her sinuses and made her nose run more.  She admits that she was maybe not taking it properly--she was spraying it straight back, which caused irritation, and she inhaled very deeply and maybe swallowed some.    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  She uses it prn outbreaks;  She has had 4 outbreaks in the last 6 months.  She thinks it is triggered by moisture/heat and stress from school.  GERD:  She has been taking Prilosec OTC twice daily for the last 2 weeks, due to having recurrent heartburn in the afternoons.  She has been having more tomato-based sauces, orange juice  Past Medical History  Diagnosis Date  . Asthma   . Genital herpes   . Smoker     former (quit 04/2012)   Past Surgical History  Procedure Laterality Date  . Im nailing humerus  2001    ORIF  . Cervical fusion  2001  . Dilatation & currettage/hysteroscopy with resectocope N/A 03/02/2013    Procedure: DILATATION & CURETTAGE/HYSTEROSCOPY WITH RESECTOCOPE;  Surgeon: [name], MD;  Location: [facility];  Service: Gynecology;  Laterality:  N/A;  removal of submucosal myoma   History   Social History  . Marital Status: Married    Spouse Name: N/A    Number of Children: N/A  . Years of Education: N/A   Occupational History  . student    Social History Main Topics  . Smoking status: Former Smoker -- 0.00 packs/day    Types: Cigarettes    Quit date: 05/26/2012  . Smokeless tobacco: Never Used  . Alcohol Use: Yes     Comment: 1-2 drinks on weekends  . Drug Use: No  . Sexual Activity: Yes    Partners: Male    Birth Control/ Protection: None     Comment: trying for pregnancy   Other Topics Concern  . Not on file   Social History Narrative   Student at [employer] ([place]).  Quit smoking 04/2012.  Married, 1 stepchild   Outpatient Encounter Prescriptions as of 05/27/2013  Medication Sig Note  . acyclovir (ZOVIRAX) 400 MG tablet Take 1 tablet (400 mg total) by mouth 3 (three) times daily. Take three times daily for 5 days for each outbreak   . albuterol (PROVENTIL HFA;VENTOLIN HFA) 108 (90 BASE) MCG/ACT inhaler Inhale 2 puffs into the lungs every 4 (four) hours as needed for wheezing or shortness of breath.   . montelukast (SINGULAIR) 10 MG tablet Take 1 tablet (10 mg total) by  mouth at bedtime.   Marland Kitchen omeprazole (PRILOSEC) 20 MG capsule Take 20 mg by mouth daily as needed (for Reflux). 05/27/2013: She has been taking it twice daily for the last couple of weeks  . [DISCONTINUED] acyclovir (ZOVIRAX) 400 MG tablet Take 1 tablet (400 mg total) by mouth 3 (three) times daily. Take three times daily for 5 days for each outbreak   . [DISCONTINUED] montelukast (SINGULAIR) 10 MG tablet Take 1 tablet (10 mg total) by mouth at bedtime.   . diphenhydrAMINE (COMPLETE ALLERGY RELIEF) 25 MG tablet Take 25 mg by mouth every 6 (six) hours as needed. 05/27/2013: Uses as needed for trouble sleeping; not using now  . Fluticasone-Salmeterol (ADVAIR DISKUS) 250-50 MCG/DOSE AEPB Inhale 1 puff into the lungs 2 (two) times daily.   .  [DISCONTINUED] Fluticasone-Salmeterol (ADVAIR DISKUS) 250-50 MCG/DOSE AEPB Inhale 1 puff into the lungs 2 (two) times daily. 05/27/2013: Out of for at least 6 months  . [DISCONTINUED] naproxen sodium (ANAPROX) 220 MG tablet Take 440 mg by mouth 2 (two) times daily with a meal.    . [DISCONTINUED] oxyCODONE-acetaminophen (PERCOCET) 7.5-325 MG per tablet Take 1 tablet by mouth every 4 (four) hours as needed for pain.    No Known Allergies  ROS:  Denies fevers, chills, nausea, vomiting, diarrhea, bleeding, bruising, chest pain, joint pains.  +allergies, wheezing, heartburn as per HPI.  PHYSICAL EXAM: BP 118/84  Pulse 76  Ht 5\' 5"  (1.651 m)  Wt 223 lb (101.152 kg)  BMI 37.11 kg/m2  LMP 05/02/2013 Well developed, pleasant obese female in no distress HEENT:  PERRL, EOMI, conjunctiva clear.  TMs and EAC's normal.  Nasal mucosa mildly edematous, no purulence.  Sinuses nontender.  OP clear without erythema or lesions Neck: no lymphadenopathy, thyromegaly or mass Heart: regular rate and rhythm without murmur Lungs clear bilaterally.  She has no wheezes, rales or ronchi.  Some decreased air movement. No prolonged expiratory phase.  Peak flow 330/315/305 (expected is approx 450) Extremities: no edema Psych: normal mood, affect, hygiene and grooming Neuro: alert and oriented, cranial nerves intact. Normal strength, gait  ASSESSMENT/PLAN:  Asthma exacerbation - Plan: Fluticasone-Salmeterol (ADVAIR DISKUS) 250-50 MCG/DOSE AEPB  Asthma flare - Plan: montelukast (SINGULAIR) 10 MG tablet  Genital HSV - Plan: acyclovir (ZOVIRAX) 400 MG tablet  Allergic rhinitis, cause unspecified  GERD (gastroesophageal reflux disease)   Allergies--continue Singulair.  Add antihistamine such as claritin, zyrtec or allegra.  Consider restarting Nasacort (or Flonase) with proper technique--gentle sniffs, and aim slightly to the outer portion of the nose, not the central portion.  Asthma--suboptimally controlled  due to being out of Advair.  Restart Advair--remember to rinse mouth after using it.  GERD--continue prilosec up to twice daily, if needed. Try and make some dietary changes (avoid citrus, acidic foods such as tomatoes, caffeine, etc), small frequent meals, and waiting at least 2 hours before lying down after eating), and you likely will be able to get back down to once daily dosing.  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  You must get flu shots every fall!  2 weeks of advair 250/50 samples given  F/u 6 mos for CPE, sooner prn for suboptimal control of asthma or other concerns

## 2013-10-22 ENCOUNTER — Telehealth: Payer: Self-pay | Admitting: Family Medicine

## 2013-10-22 ENCOUNTER — Other Ambulatory Visit: Payer: Self-pay | Admitting: Family Medicine

## 2013-10-22 DIAGNOSIS — J45901 Unspecified asthma with (acute) exacerbation: Secondary | ICD-10-CM

## 2013-10-22 MED ORDER — ALBUTEROL SULFATE HFA 108 (90 BASE) MCG/ACT IN AERS: 2.0000 | INHALATION_SPRAY | RESPIRATORY_TRACT | Status: DC | PRN

## 2013-10-22 NOTE — Telephone Encounter (Signed)
Ok to refill albuterol MDI #1. Message doesn't state which pharmacy (last one used was [facility] which was mail order--I'm assuming she doesn't want that one--please verify her pharmacy and approve 1 refill)

## 2013-10-22 NOTE — Telephone Encounter (Signed)
Inhaler Rx sent to [facility] in [place]. CLS

## 2013-10-25 ENCOUNTER — Telehealth: Payer: Self-pay | Admitting: Medical

## 2013-10-25 DIAGNOSIS — J45901 Unspecified asthma with (acute) exacerbation: Secondary | ICD-10-CM

## 2013-10-25 MED ORDER — ALBUTEROL SULFATE HFA 108 (90 BASE) MCG/ACT IN AERS: 2.0000 | INHALATION_SPRAY | RESPIRATORY_TRACT | Status: DC | PRN

## 2013-10-25 NOTE — Telephone Encounter (Signed)
Pt called & states was given Ventolin instead of Proventil.  She states Ventolin does not work for her.  I called pharmacy s/w [facility] & they reran the Rx as Proventil and ins will not pay until 11/04/13, pharmacy recommend that pt call her ins for an exception because she cant return the Ventolin.  Called pt & explained situation,  She will call ins company for an exception and call me back if she has any problems.

## 2013-10-25 NOTE — Telephone Encounter (Signed)
Dr. Tomi Bamberger do you want to write a letter of medical necessity for Proventil due to error of pt getting Flovent instead.  See notes, ins will not pay for another.  I called Rep & no one samples this anymore.

## 2013-10-25 NOTE — Telephone Encounter (Signed)
[facility] called to S/W Tonya Perkins however Tonya Perkins was on another call so he left message for Tonya Perkins that he tried to rerun Proventil and S/W insur to see if he could get med to go through but he was told that only way this may be covered is from provider to submit a letter of medical necessity and we will get a response back within 24-72 hours.

## 2013-10-25 NOTE — Telephone Encounter (Signed)
Message for refill was from patient (not pharmacy) and only said requesting refill on albuterol--the message did NOT specify Proventil only. There should NOT be a difference between ventolin and proventil--these are both albuterol, and should work the same. There is no documented allergy or reason that she cannot take ventolin.  If there is any difference in efficacy, it is possible that it is related to the delivery (ie the speed of how it comes out) and using a spacer will likely help.  It will take a while to get it approved with a letter (maybe not even before she can get another one on 9/10), so she may as well try this (and then give [place] the feedback of whether or not it helps, that we can use to write letter of medical necessity).  There are no refills available (and she is in [place] anyway, not local, right?)  I think there was an error in the last documented message--she was given ventolin, not Flovent, right? (Flovent is a completely different medication).

## 2013-10-26 ENCOUNTER — Telehealth: Payer: Self-pay | Admitting: Family Medicine

## 2013-10-26 NOTE — Telephone Encounter (Signed)
Called pt & explained situation and Dr. Johnsie Kindred notes about the Ventolin (not Flovent was typing error).  She states no allergic reaction or anything, it just takes it longer to work than the Proventil.  She is also out of Advair, she is waiting on mail order.  I explained she can get a refill on 11/04/13 per pharmacy.  She will call back if she has any other concerns.

## 2013-11-29 ENCOUNTER — Encounter: Admitting: Family Medicine

## 2013-12-08 ENCOUNTER — Encounter: Payer: Self-pay | Admitting: Internal Medicine

## 2013-12-30 ENCOUNTER — Encounter: Payer: Self-pay | Admitting: Obstetrics & Gynecology

## 2014-01-27 ENCOUNTER — Telehealth: Payer: Self-pay | Admitting: Family Medicine

## 2014-01-27 ENCOUNTER — Encounter: Admitting: Family Medicine

## 2014-01-27 NOTE — Telephone Encounter (Signed)
Pt called to cancel CPE appt for this afternoon. She is worried that she may not get here in time from [place]. Pt says it has just been difficult to make appts and travel here so wants to know if Dr Tomi Bamberger knows any primary care doctors in the [place] area that pt can possibly transfer care to because she knows she needs refills soon.

## 2014-01-27 NOTE — Telephone Encounter (Signed)
I do not know anyone in [place]. I agree she should find a local PCP. There needs to be a charge for same day cancellation of a CPE

## 2014-01-27 NOTE — Telephone Encounter (Signed)
Called to advise pt that Dr Tomi Bamberger does not have PCP's to recommend her to at this time

## 2014-01-28 NOTE — Telephone Encounter (Signed)
Gave to Melissa to charge for charge for same day cancellation

## 2014-01-31 DIAGNOSIS — Z3181 Encounter for male factor infertility in female patient: Secondary | ICD-10-CM

## 2014-01-31 DIAGNOSIS — N978 Female infertility of other origin: Secondary | ICD-10-CM | POA: Insufficient documentation

## 2014-03-28 ENCOUNTER — Ambulatory Visit (INDEPENDENT_AMBULATORY_CARE_PROVIDER_SITE_OTHER): Admitting: Family Medicine

## 2014-03-28 ENCOUNTER — Encounter: Payer: Self-pay | Admitting: Family Medicine

## 2014-03-28 VITALS — BP 122/84 | HR 80 | Ht 65.0 in | Wt 223.0 lb

## 2014-03-28 DIAGNOSIS — M25571 Pain in right ankle and joints of right foot: Secondary | ICD-10-CM

## 2014-03-28 DIAGNOSIS — Z23 Encounter for immunization: Secondary | ICD-10-CM

## 2014-03-28 DIAGNOSIS — J45901 Unspecified asthma with (acute) exacerbation: Secondary | ICD-10-CM

## 2014-03-28 DIAGNOSIS — A6000 Herpesviral infection of urogenital system, unspecified: Secondary | ICD-10-CM

## 2014-03-28 DIAGNOSIS — J302 Other seasonal allergic rhinitis: Secondary | ICD-10-CM

## 2014-03-28 MED ORDER — ACYCLOVIR 400 MG PO TABS: 400.0000 mg | ORAL_TABLET | Freq: Three times a day (TID) | ORAL | Status: DC

## 2014-03-28 MED ORDER — ALBUTEROL SULFATE HFA 108 (90 BASE) MCG/ACT IN AERS: INHALATION_SPRAY | RESPIRATORY_TRACT | Status: DC

## 2014-03-28 MED ORDER — ALBUTEROL SULFATE HFA 108 (90 BASE) MCG/ACT IN AERS: 2.0000 | INHALATION_SPRAY | RESPIRATORY_TRACT | Status: DC | PRN

## 2014-03-28 MED ORDER — FLUTICASONE-SALMETEROL 250-50 MCG/DOSE IN AEPB: 1.0000 | INHALATION_SPRAY | Freq: Two times a day (BID) | RESPIRATORY_TRACT | Status: DC

## 2014-03-28 NOTE — Progress Notes (Signed)
Chief Complaint  Patient presents with  . Ankle Pain    right ankle x 1 month. Also needs asthma meds refilled.    She has had pain in her right lateral ankle for about a month.  It is not currently hurting.  It started when she started doing yoga.  It hurts with yoga, but she doesn't stop doing it, continues to feel sore afterwards.  "It is in the bone".  Denies any recent injury, strain/sprain.  It was swollen a couple of weeks ago, not recently. Currently denies pain, swelling, or pain with walking/weight-bearing. No known injury or trauma.  Patient is past due for follow-up on her asthma.  She currently lives in [place], and states she is "picky" about choosing another doctor.  She has been out of her Advair.  She has been stretching it out, and last used it last week.  She has been needing to use her albuterol 2-3 times/day since then.  Walking between classes is hard, and she needs to use it before yoga.    She is only using the prilosec prn--using it after she gets heartburn (after eating certain foods, such as tomato sauce on pasta).  She sometimes notices her chest getting a little tight, but not to wear she feels like she needs to use her inhaler.  Past Medical History  Diagnosis Date  . Asthma   . Genital herpes   . Smoker     former (quit 04/2012)  . Allergy     grass pollen,dust mites, dog,cat,molds   Past Surgical History  Procedure Laterality Date  . Im nailing humerus  2001    ORIF  . Cervical fusion  2001  . Dilatation & currettage/hysteroscopy with resectocope N/A 03/02/2013    Procedure: DILATATION & CURETTAGE/HYSTEROSCOPY WITH RESECTOCOPE;  Surgeon: [name], MD;  Location: [facility];  Service: Gynecology;  Laterality: N/A;  removal of submucosal myoma   History   Social History  . Marital Status: Married    Spouse Name: N/A    Number of Children: N/A  . Years of Education: N/A   Occupational History  . student    Social History Main Topics  .  Smoking status: Former Smoker -- 0.00 packs/day    Types: Cigarettes    Quit date: 05/26/2012  . Smokeless tobacco: Never Used  . Alcohol Use: Yes     Comment: 1-2 drinks on weekends  . Drug Use: No  . Sexual Activity:    Partners: Male    Birth Control/ Protection: None     Comment: trying for pregnancy   Other Topics Concern  . Not on file   Social History Narrative   Student at [employer] ([place]).  Quit smoking 04/2012.  Married, 1 stepchild    Outpatient Encounter Prescriptions as of 03/28/2014  Medication Sig Note  . albuterol (PROVENTIL HFA;VENTOLIN HFA) 108 (90 BASE) MCG/ACT inhaler Inhale 2 puffs into the lungs every 4 (four) hours as needed for wheezing or shortness of breath (NEED PROVENTIL ONLY,  PT CAN NOT USE VENTOLIN). 03/28/2014: Needing about 2-3x/day since running out of Advair  . cetirizine (ZYRTEC) 10 MG tablet Take 10 mg by mouth daily.   . diphenhydrAMINE (COMPLETE ALLERGY RELIEF) 25 MG tablet Take 25 mg by mouth every 6 (six) hours as needed. 03/28/2014: Uses prn for trouble sleeping; uses sporadically  . fluticasone (FLONASE) 50 MCG/ACT nasal spray Place 1 spray into both nostrils daily.   Marland Kitchen omeprazole (PRILOSEC) 20 MG capsule Take 20  mg by mouth daily as needed (for Reflux). 03/28/2014: Taking once daily, just prn  . acyclovir (ZOVIRAX) 400 MG tablet Take 1 tablet (400 mg total) by mouth 3 (three) times daily. Take three times daily for 5 days for each outbreak (Patient not taking: Reported on 03/28/2014) 03/28/2014: Uses only prn; needs refill  . Fluticasone-Salmeterol (ADVAIR DISKUS) 250-50 MCG/DOSE AEPB Inhale 1 puff into the lungs 2 (two) times daily. (Patient not taking: Reported on 03/28/2014) 03/28/2014: Ran out last week (and using once daily rather than BID to make it last, prior to that)  . montelukast (SINGULAIR) 10 MG tablet Take 1 tablet (10 mg total) by mouth at bedtime. (Patient not taking: Reported on 03/28/2014) 03/28/2014: She hasn't been using this  lately, stopped about 3 months   No Known Allergies   ROS:  No fevers, chills, headaches, dizziness, chest pain, palpitations, URI symptoms.  No nausea, vomiting, bowel changes. +ankle pain, asthma flare/wheezing since being out of Advair.  Allergies are fairly well controlled with Flonase. Moods are good. No bleeding, bruising, rash or other concerns.  PHYSICAL EXAM: BP 122/84 mmHg  Pulse 80  Ht 5\' 5"  (1.651 m)  Wt 223 lb (101.152 kg)  BMI 37.11 kg/m2  LMP 03/24/2014 Well developed, pleasant, overweight female in no distress.  Speaking easily in full sentences.  She states she last used inhaler 7 hours ago. HEENT: PERRL, EOMI, conjunctiva clear.  OP clear. Sinuses nontender Neck: no lymphadenopathy, thyromegaly or mass Heart: regular rate and rhythm Lungs: clear with good air movement. No wheezes, rales, ronchi PF 350/350/320 (goal is >430) Abdomen: soft, obese, nontender, no organomegaly or mass Extremities: no edema. 2+ pulses Right ankle--mobile ganglion cyst, nontender, noted anterior to the lateral malleolus.  Normal strength, no pain with flexion/extension/inverson/eversion against resistance Skin: no rashes Psych: normal mood, affect, hygiene and grooming  ASSESSMENT/PLAN:  Asthma exacerbation - Plan: Fluticasone-Salmeterol (ADVAIR DISKUS) 250-50 MCG/DOSE AEPB  Asthma flare - related to noncompliance with medicaton (related to noncompliance with f/u due to moving [place]).  Needs PFT's and local MD (don't have equip yet here for PFT) - Plan: albuterol (PROVENTIL HFA;VENTOLIN HFA) 108 (90 BASE) MCG/ACT inhaler, DISCONTINUED: albuterol (PROVENTIL HFA;VENTOLIN HFA) 108 (90 BASE) MCG/ACT inhaler  Genital HSV - sporadic. cont prn use of zovirax - Plan: acyclovir (ZOVIRAX) 400 MG tablet  Need for prophylactic vaccination and inoculation against influenza - discussed importance of yearly flu shots, and to get earlier in the fall in the future. - Plan: Flu Vaccine QUAD 36+ mos PF IM  (Fluarix Quad PF)  Right ankle pain - suspect mild strain, possibly positional related to yoga (resolved).  small, nontender ganglion cyst also present--reassured, no tx needed  Seasonal allergies - continue flonase.  encouraged to restart Singulair (also to help with asthma)--states she still has at home  Flu shot today.  Needs PFT's when we get monitor (or through new doc)  It is important for you to get your flu shots every year. Try and get them earlier in the future (ideally in the Fall--we get them in by early September).  Please try and find a doctor closer to your home [facility]).  Ask your friends, and feel free to check on the [facility] website to ensure they haven't had problems (ie license suspensions, malpractice cases, etc).  Restart the singulair once daily. Restart the Advair twice daily. Use the prilosec PRIOR to a meal that usually triggers your heartburn.  You might want to use it every day until  your asthma is under better control (any reflux can worsen your asthma/wheezing).

## 2014-03-28 NOTE — Patient Instructions (Signed)
It is important for you to get your flu shots every year. Try and get them earlier in the future (ideally in the Fall--we get them in by early September).  Please try and find a doctor closer to your home [facility]).  Ask your friends, and feel free to check on the [facility] website to ensure they haven't had problems (ie license suspensions, malpractice cases, etc).  You will need to have PFT (pulmonary function tests) done once you are back on your medications.  Our machine is not yet available.  If you don't return here for them, you should get them with your new doctor in [place].  Restart the singulair once daily. Restart the Advair twice daily. Use the prilosec PRIOR to a meal that usually triggers your heartburn.  You might want to use it every day until your asthma is under better control (any reflux can worsen your asthma/wheezing).  Your ankle seems fine--you likely are straining the tendons/ligaments with certain positions in yoga class.  Please try and modify the painful positions while in class, rather than continuing them. Your likely have a small ganglion cyst at the ankle, that doesn't seem to be bothering you.  Ganglion Cyst A ganglion cyst is a noncancerous, fluid-filled lump that occurs near joints or tendons. The ganglion cyst grows out of a joint or the lining of a tendon. It most often develops in the hand or wrist but can also develop in the shoulder, elbow, hip, knee, ankle, or foot. The round or oval ganglion can be pea sized or larger than a grape. Increased activity may enlarge the size of the cyst because more fluid starts to build up.  CAUSES  It is not completely known what causes a ganglion cyst to grow. However, it may be related to:  Inflammation or irritation around the joint.  An injury.  Repetitive movements or overuse.  Arthritis. SYMPTOMS  A lump most often appears in the hand or wrist, but can occur in other areas of the body.  Generally, the lump is painless without other symptoms. However, sometimes pain can be felt during activity or when pressure is applied to the lump. The lump may even be tender to the touch. Tingling, pain, numbness, or muscle weakness can occur if the ganglion cyst presses on a nerve. Your grip may be weak and you may have less movement in your joints.  DIAGNOSIS  Ganglion cysts are most often diagnosed based on a physical exam, noting where the cyst is and how it looks. Your caregiver will feel the lump and may shine a light alongside it. If it is a ganglion, a light often shines through it. Your caregiver may order an X-ray, ultrasound, or MRI to rule out other conditions. TREATMENT  Ganglions usually go away on their own without treatment. If pain or other symptoms are involved, treatment may be needed. Treatment is also needed if the ganglion limits your movement or if it gets infected. Treatment options include:  Wearing a wrist or finger brace or splint.  Taking anti-inflammatory medicine.  Draining fluid from the lump with a needle (aspiration).  Injecting a steroid into the joint.  Surgery to remove the ganglion cyst and its stalk that is attached to the joint or tendon. However, ganglion cysts can grow back. HOME CARE INSTRUCTIONS   Do not press on the ganglion, poke it with a needle, or hit it with a heavy object. You may rub the lump gently and often. Sometimes fluid moves  out of the cyst.  Only take medicines as directed by your caregiver.  Wear your brace or splint as directed by your caregiver. SEEK MEDICAL CARE IF:   Your ganglion becomes larger or more painful.  You have increased redness, red streaks, or swelling.  You have pus coming from the lump.  You have weakness or numbness in the affected area. MAKE SURE YOU:   Understand these instructions.  Will watch your condition.  Will get help right away if you are not doing well or get worse. Document Released:  02/09/2000 Document Revised: 11/06/2011 Document Reviewed: 04/07/2007 [facility] Patient Information 2015 [place], [place]. This information is not intended to replace advice given to you by your health care provider. Make sure you discuss any questions you have with your health care provider.

## 2014-04-12 ENCOUNTER — Telehealth: Payer: Self-pay | Admitting: Family Medicine

## 2014-04-12 DIAGNOSIS — J45901 Unspecified asthma with (acute) exacerbation: Secondary | ICD-10-CM

## 2014-04-12 MED ORDER — ALBUTEROL SULFATE HFA 108 (90 BASE) MCG/ACT IN AERS: INHALATION_SPRAY | RESPIRATORY_TRACT | Status: DC

## 2014-04-12 NOTE — Telephone Encounter (Signed)
Requesting an emergency supply of Albuterol inhale to [facility] @ [place] , [place]. Pt is waiting for meds to come in from [facility] mail order however her chest is very tight and heavy so she needs the inhaler ASAP

## 2014-04-12 NOTE — Telephone Encounter (Signed)
done

## 2014-04-28 ENCOUNTER — Encounter: Admitting: Family Medicine

## 2014-08-10 ENCOUNTER — Other Ambulatory Visit: Payer: Self-pay | Admitting: Specialist

## 2014-08-10 DIAGNOSIS — R51 Headache: Principal | ICD-10-CM

## 2014-08-10 DIAGNOSIS — G43809 Other migraine, not intractable, without status migrainosus: Secondary | ICD-10-CM

## 2014-08-10 DIAGNOSIS — R519 Headache, unspecified: Secondary | ICD-10-CM

## 2014-08-24 ENCOUNTER — Ambulatory Visit
Admission: RE | Admit: 2014-08-24 | Discharge: 2014-08-24 | Disposition: A | Discharge status: Home / Self Care | Source: Ambulatory Visit | Attending: Specialist | Admitting: Specialist

## 2014-08-24 DIAGNOSIS — R51 Headache: Principal | ICD-10-CM

## 2014-08-24 DIAGNOSIS — G43809 Other migraine, not intractable, without status migrainosus: Secondary | ICD-10-CM

## 2014-08-24 DIAGNOSIS — R519 Headache, unspecified: Secondary | ICD-10-CM

## 2014-08-30 ENCOUNTER — Telehealth: Payer: Self-pay | Admitting: Family Medicine

## 2014-08-30 NOTE — Telephone Encounter (Signed)
We have no record of any of her childhood vaccines. Will she need a form filled out by [place] regarding her vaccines for school?  If so, she needs to schedule appointment and bring copies of her childhood vaccines (and blood can be drawn at visit for any necessary titers).  If she doesn't need anything signed by [place], but just needs proof of immunity for MMR and varicella, then needs blood drawn for titers (NOT rx for vaccine to pharmacy as requested in last message,since she said she had chicken pox in the past).  If titers are negative, then she will need additional vaccines

## 2014-08-30 NOTE — Telephone Encounter (Signed)
Pt called and states her school is requiring her to have a Chicken pox vaccine.  She has had the chicken pox, so I explained possible titer.  She wants to know if you will send a rx to [facility] on [address] for the vaccine.  Pt ph [phone]

## 2014-08-30 NOTE — Telephone Encounter (Signed)
Patient called back,  also wants to know about MMR titer

## 2014-08-30 NOTE — Telephone Encounter (Signed)
Please see telephone call prior, closed by accident.

## 2014-08-31 NOTE — Telephone Encounter (Signed)
Pt states that there are forms for her vaccines to be filled out. Pt was told to bring her childhood vaccines with her so Dr. Tomi Bamberger could look at them and if there was lab work needed we would do at the time of appt. Pt is scheduled for next Wednesday for forms

## 2014-09-07 ENCOUNTER — Ambulatory Visit (INDEPENDENT_AMBULATORY_CARE_PROVIDER_SITE_OTHER): Admitting: Family Medicine

## 2014-09-07 ENCOUNTER — Encounter: Payer: Self-pay | Admitting: Family Medicine

## 2014-09-07 VITALS — BP 130/80 | HR 72 | Ht 65.0 in | Wt 216.6 lb

## 2014-09-07 DIAGNOSIS — J45901 Unspecified asthma with (acute) exacerbation: Secondary | ICD-10-CM | POA: Diagnosis not present

## 2014-09-07 DIAGNOSIS — J309 Allergic rhinitis, unspecified: Secondary | ICD-10-CM | POA: Diagnosis not present

## 2014-09-07 DIAGNOSIS — Z7189 Other specified counseling: Secondary | ICD-10-CM | POA: Diagnosis not present

## 2014-09-07 DIAGNOSIS — Z7185 Encounter for immunization safety counseling: Secondary | ICD-10-CM

## 2014-09-07 DIAGNOSIS — E669 Obesity, unspecified: Secondary | ICD-10-CM

## 2014-09-07 DIAGNOSIS — Z23 Encounter for immunization: Secondary | ICD-10-CM | POA: Diagnosis not present

## 2014-09-07 DIAGNOSIS — Z1159 Encounter for screening for other viral diseases: Secondary | ICD-10-CM

## 2014-09-07 NOTE — Patient Instructions (Signed)
We will let you know your blood tests in the next few days. If the Hepatitis B titer shows that you are immune, then you do not need to continue the rest of the second series that you recently started.

## 2014-09-07 NOTE — Progress Notes (Signed)
Chief Complaint  Patient presents with  . Immunization updates    is in need of 2nd MMR, Varicella titer. Started hep b series August 11, 2014, did not realize she had series as a child-wants to know if she should continue with the 2nd and 3rd dose.   . Form    also has a form that she want to know if you can fill out, I believe she may actually need a CPE?    Going to [facility] for LPN.  She denies possible pregnancy, as she just started her menstrual cycle (on time, regular).  Has an allergist out in [place].  She was changed from Advair to [place] last week.  She had been needing albuterol daily prior to this change, and is doing much better. She admits that she didn't always use the Advair twice daily.  Hasn't needed the albuterol all week. She plans to start allergy shots soon.   Immunization History  Administered Date(s) Administered  . DTaP 02/05/1982, 04/10/1982, 08/16/1982, 03/03/1987  . Hepatitis B 11/28/1993, 01/02/1994, 06/12/1994, 08/11/2014  . IPV 02/05/1982, 04/10/1982, 03/03/1987  . Influenza Split 02/07/2011, 12/27/2011  . Influenza,inj,Quad PF,36+ Mos 03/28/2014  . MMR 04/05/1983  . Pneumococcal Polysaccharide-23 08/15/2010  . Tdap 04/08/2006    She has lost 5.5 pounds since her last visit.  She denies fever, chills.  +allergies/congestion, no sinus headaches or purulence or other URI symptoms.  No cough.  Asthma improved, per HPI.  No nausea, vomiting, heartburn, bowel changes, bleeding, bruising, rash. No depression, anxiety. Menses are regular, monthly. Denies chest pain, palpitations, joint pains or any other complaints.  PHYSICAL EXAM: BP 130/80 mmHg  Pulse 72  Ht 5' 5"  (1.651 m)  Wt 216 lb 9.6 oz (98.249 kg)  BMI 36.04 kg/m2  LMP 09/07/2014   Well developed, pleasant, obese female in no distress.  Her husband accompanies her today HEENT: PERRL, conjunctiva clear. EOMI.  OP clear Neck: No lymphadenopathy, thyromegaly or mass Heart: regular rate  and rhythm without murmur Lungs: clear bilaterally without wheezes, rales, ronchi Abdomen: soft, nontender, no mass Extremities: no edema, normal pulses Skin: no lesions/rashes Neuro: alert and oriented.  Cranial nerves intact. Normal strength, gait Psych: normal mood, affect, hygiene and grooming.  ASSESSMENT/PLAN:  Need for MMR vaccine - 2nd dose given. Advised of contraindication with pregnancy--she denies pregnancy, started menses (on time) today - Plan: MMR vaccine subcutaneous  Immunization counseling - risks/side effects reviewed.   Screening for viral disease - screening for immunity to varicella, Hepatitis B (she had full series; no further needed if +titer - Plan: Varicella zoster antibody, IgG, Hepatitis B surface antibody  Obesity - congratulated on weight loss; encouraged further weight loss  Allergic rhinitis, unspecified allergic rhinitis type - plans to start allergy shots soon  Asthma exacerbation - improved since changing from advair to [place]; continue to f/u with allergist in [place]   MMR #2 today.   Varicella titer HepB surface Ab--if not immune, then she needs to complete the last 2 shots of the 2nd series. If immune, then she can stop.  Form filled out--no medical or emotional contraindication for caring for patients.

## 2014-09-08 LAB — HEPATITIS B SURFACE ANTIBODY,QUALITATIVE: Hep B S Ab: POSITIVE — AB

## 2014-09-08 LAB — VARICELLA ZOSTER ANTIBODY, IGG: Varicella IgG: 1989 Index — ABNORMAL HIGH (ref <135.00)

## 2014-10-27 DIAGNOSIS — F988 Other specified behavioral and emotional disorders with onset usually occurring in childhood and adolescence: Secondary | ICD-10-CM

## 2014-10-27 HISTORY — DX: Other specified behavioral and emotional disorders with onset usually occurring in childhood and adolescence: F98.8

## 2014-11-25 ENCOUNTER — Telehealth: Payer: Self-pay | Admitting: Family Medicine

## 2014-11-25 ENCOUNTER — Encounter: Payer: Self-pay | Admitting: Family Medicine

## 2014-11-25 NOTE — Telephone Encounter (Signed)
Pt's Psychiatric doctor, Brigitte Pulse, called requesting that Dr Tomi Bamberger call him on his cell# at [phone]. Pt was in his office being seen currently and Dr Enedina Finner was wanting to put the pt on Adderall today however he wanted to discuss this with you given the pt's hypertension history.

## 2014-11-25 NOTE — Telephone Encounter (Signed)
He is planning to start low dose, Adderall XR 5mg . Some pre-HTN hx, some elevated BP's.  Chart reviewed Advised him that I am fine with her starting low dose, as long as BPs are being monitored.

## 2015-08-25 ENCOUNTER — Encounter: Payer: Self-pay | Admitting: Obstetrics & Gynecology
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
## 2016-11-05 ENCOUNTER — Encounter: Admitting: Obstetrics & Gynecology

## 2016-12-05 ENCOUNTER — Encounter: Payer: Self-pay | Admitting: Obstetrics & Gynecology

## 2016-12-05 ENCOUNTER — Ambulatory Visit (INDEPENDENT_AMBULATORY_CARE_PROVIDER_SITE_OTHER): Admitting: Obstetrics & Gynecology

## 2016-12-05 VITALS — BP 134/86 | Ht 65.0 in | Wt 245.0 lb

## 2016-12-05 DIAGNOSIS — Z1151 Encounter for screening for human papillomavirus (HPV): Secondary | ICD-10-CM | POA: Diagnosis not present

## 2016-12-05 DIAGNOSIS — A6004 Herpesviral vulvovaginitis: Secondary | ICD-10-CM | POA: Diagnosis not present

## 2016-12-05 DIAGNOSIS — Z01419 Encounter for gynecological examination (general) (routine) without abnormal findings: Secondary | ICD-10-CM

## 2016-12-05 DIAGNOSIS — A6000 Herpesviral infection of urogenital system, unspecified: Secondary | ICD-10-CM | POA: Diagnosis not present

## 2016-12-05 DIAGNOSIS — N468 Other male infertility: Secondary | ICD-10-CM

## 2016-12-05 DIAGNOSIS — Z6841 Body Mass Index (BMI) 40.0 and over, adult: Secondary | ICD-10-CM

## 2016-12-05 MED ORDER — VALACYCLOVIR HCL 500 MG PO TABS: 500.0000 mg | ORAL_TABLET | Freq: Every day | ORAL | 4 refills | Status: DC

## 2016-12-05 NOTE — Progress Notes (Signed)
Tonya Perkins 04-03-1980 [id number]   History:    36 y.o. G0  Married.    RP:  Established patient for annual gyn exam   HPI: Menses regular normal everyday.  Attempting conception x >5 years.  HSG Patent tubes bilaterally.  Sperm analysis low sperm count and motility.  Saving money to attempt IVF, will let me know when ready for referral.  No pelvic pain.  No recurrence of Genital HSV on prophylaxis.  Vulvar itching after menses occasionally.  Breasts wnl.  Mictions/BMs wnl.  BMI 40.77.  Trying to loose weight.    Past medical history,surgical history, family history and social history were all reviewed and documented in the EPIC chart.  Gynecologic History Patient's last menstrual period was 11/22/2016. Contraception: Infertility x >5 years Last Pap: 2017. Results were: normal Last mammogram: Never.  Obstetric History OB History  Gravida Para Term Preterm AB Living  0 0 0 0 0 0  SAB TAB Ectopic Multiple Live Births  0 0 0 0 0         ROS: A ROS was performed and pertinent positives and negatives are included in the history.  GENERAL: No fevers or chills. HEENT: No change in vision, no earache, sore throat or sinus congestion. NECK: No pain or stiffness. CARDIOVASCULAR: No chest pain or pressure. No palpitations. PULMONARY: No shortness of breath, cough or wheeze. GASTROINTESTINAL: No abdominal pain, nausea, vomiting or diarrhea, melena or bright red blood per rectum. GENITOURINARY: No urinary frequency, urgency, hesitancy or dysuria. MUSCULOSKELETAL: No joint or muscle pain, no back pain, no recent trauma. DERMATOLOGIC: No rash, no itching, no lesions. ENDOCRINE: No polyuria, polydipsia, no heat or cold intolerance. No recent change in weight. HEMATOLOGICAL: No anemia or easy bruising or bleeding. NEUROLOGIC: No headache, seizures, numbness, tingling or weakness. PSYCHIATRIC: No depression, no loss of interest in normal activity or change in sleep pattern.     Exam:   BP  134/86   Ht 5\' 5"  (1.651 m)   Wt 245 lb (111.1 kg)   LMP 11/22/2016 Comment: no birth control   BMI 40.77 kg/m   Body mass index is 40.77 kg/m.  General appearance : Well developed well nourished female. No acute distress HEENT: Eyes: no retinal hemorrhage or exudates,  Neck supple, trachea midline, no carotid bruits, no thyroidmegaly Lungs: Clear to auscultation, no rhonchi or wheezes, or rib retractions  Heart: Regular rate and rhythm, no murmurs or gallops Breast:Examined in sitting and supine position were symmetrical in appearance, no palpable masses or tenderness,  no skin retraction, no nipple inversion, no nipple discharge, no skin discoloration, no axillary or supraclavicular lymphadenopathy Abdomen: no palpable masses or tenderness, no rebound or guarding Extremities: no edema or skin discoloration or tenderness  Pelvic: Vulva normal  Bartholin, Urethra, Skene Glands: Within normal limits             Vagina: No gross lesions or discharge  Cervix: No gross lesions or discharge.  Pap/HPV done.  Uterus  AV, normal size, shape and consistency, non-tender and mobile  Adnexa  Without masses or tenderness  Anus and perineum  normal   Assessment/Plan:  36 y.o. female for annual exam   1. Encounter for routine gynecological examination with Papanicolaou smear of cervix Normal gyn exam.  Pap/HPV done.  Breasts wnl.  2. Primary female infertility Will let me know when ready for referral to Fertility specialist.  3. Class 3 severe obesity due to excess calories without serious comorbidity with body  mass index (BMI) of 40.0 to 44.9 in adult [facility]) Low carb/calorie diet like [employer] recommended.  Joined a gym, physical activity 5 times per week recommended.  4. Recurrent genital HSV (herpes simplex virus) infection Valacyclovir prophylaxis sent.  Counseling on above issues >50% x 10 minutes.  [name] MD, 8:32 AM 12/05/2016

## 2016-12-05 NOTE — Patient Instructions (Signed)
1. Encounter for routine gynecological examination with Papanicolaou smear of cervix Normal gyn exam.  Pap/HPV done.  Breasts wnl.  2. Primary female infertility Will let me know when ready for referral to Fertility specialist.  3. Class 3 severe obesity due to excess calories without serious comorbidity with body mass index (BMI) of 40.0 to 44.9 in adult [facility]) Low carb/calorie diet like [employer] recommended.  Joined a gym, physical activity 5 times per week recommended.  4. Recurrent genital HSV (herpes simplex virus) infection Valacyclovir prophylaxis sent.  Tonya Perkins, it was a pleasure to see you today!  I will inform you of your results as soon as available.   Exercising to Lose Weight Exercising can help you to lose weight. In order to lose weight through exercise, you need to do vigorous-intensity exercise. You can tell that you are exercising with vigorous intensity if you are breathing very hard and fast and cannot hold a conversation while exercising. Moderate-intensity exercise helps to maintain your current weight. You can tell that you are exercising at a moderate level if you have a higher heart rate and faster breathing, but you are still able to hold a conversation. How often should I exercise? Choose an activity that you enjoy and set realistic goals. Your health care provider can help you to make an activity plan that works for you. Exercise regularly as directed by your health care provider. This may include:  Doing resistance training twice each week, such as: ? Push-ups. ? Sit-ups. ? Lifting weights. ? Using resistance bands.  Doing a given intensity of exercise for a given amount of time. Choose from these options: ? 150 minutes of moderate-intensity exercise every week. ? 75 minutes of vigorous-intensity exercise every week. ? A mix of moderate-intensity and vigorous-intensity exercise every week.  Children, pregnant women, people who are out of shape, people  who are overweight, and older adults may need to consult a health care provider for individual recommendations. If you have any sort of medical condition, be sure to consult your health care provider before starting a new exercise program. What are some activities that can help me to lose weight?  Walking at a rate of at least 4.5 miles an hour.  Jogging or running at a rate of 5 miles per hour.  Biking at a rate of at least 10 miles per hour.  Lap swimming.  Roller-skating or in-line skating.  Cross-country skiing.  Vigorous competitive sports, such as football, basketball, and soccer.  Jumping rope.  Aerobic dancing. How can I be more active in my day-to-day activities?  Use the stairs instead of the elevator.  Take a walk during your lunch break.  If you drive, park your car farther away from work or school.  If you take public transportation, get off one stop early and walk the rest of the way.  Make all of your phone calls while standing up and walking around.  Get up, stretch, and walk around every 30 minutes throughout the day. What guidelines should I follow while exercising?  Do not exercise so much that you hurt yourself, feel dizzy, or get very short of breath.  Consult your health care provider prior to starting a new exercise program.  Wear comfortable clothes and shoes with good support.  Drink plenty of water while you exercise to prevent dehydration or heat stroke. Body water is lost during exercise and must be replaced.  Work out until you breathe faster and your heart beats faster.  This information is not intended to replace advice given to you by your health care provider. Make sure you discuss any questions you have with your health care provider. Document Released: 03/16/2010 Document Revised: 07/20/2015 Document Reviewed: 07/15/2013 Elsevier Interactive Patient Education  2018 [place] [employer] Calories are units of  energy. Your body needs a certain amount of calories from food to keep you going throughout the day. When you eat more calories than your body needs, your body stores the extra calories as fat. When you eat fewer calories than your body needs, your body burns fat to get the energy it needs. Calorie counting means keeping track of how many calories you eat and drink each day. Calorie counting can be helpful if you need to lose weight. If you make sure to eat fewer calories than your body needs, you should lose weight. Ask your health care provider what a healthy weight is for you. For calorie counting to work, you will need to eat the right number of calories in a day in order to lose a healthy amount of weight per week. A dietitian can help you determine how many calories you need in a day and will give you suggestions on how to reach your calorie goal.  A healthy amount of weight to lose per week is usually 1-2 lb (0.5-0.9 kg). This usually means that your daily calorie intake should be reduced by 500-750 calories.  Eating 1,200 - 1,500 calories per day can help most women lose weight.  Eating 1,500 - 1,800 calories per day can help most men lose weight.  What is my plan? My goal is to have __________ calories per day. If I have this many calories per day, I should lose around __________ pounds per week. What do I need to know about calorie counting? In order to meet your daily calorie goal, you will need to:  Find out how many calories are in each food you would like to eat. Try to do this before you eat.  Decide how much of the food you plan to eat.  Write down what you ate and how many calories it had. Doing this is called keeping a food log.  To successfully lose weight, it is important to balance calorie counting with a healthy lifestyle that includes regular activity. Aim for 150 minutes of moderate exercise (such as walking) or 75 minutes of vigorous exercise (such as running) each  week. Where do I find calorie information?  The number of calories in a food can be found on a Nutrition Facts label. If a food does not have a Nutrition Facts label, try to look up the calories online or ask your dietitian for help. Remember that calories are listed per serving. If you choose to have more than one serving of a food, you will have to multiply the calories per serving by the amount of servings you plan to eat. For example, the label on a package of bread might say that a serving size is 1 slice and that there are 90 calories in a serving. If you eat 1 slice, you will have eaten 90 calories. If you eat 2 slices, you will have eaten 180 calories. How do I keep a food log? Immediately after each meal, record the following information in your food log:  What you ate. Don't forget to include toppings, sauces, and other extras on the food.  How much you ate. This can be measured in  cups, ounces, or number of items.  How many calories each food and drink had.  The total number of calories in the meal.  Keep your food log near you, such as in a small notebook in your pocket, or use a mobile app or website. Some programs will calculate calories for you and show you how many calories you have left for the day to meet your goal. What are some calorie counting tips?  Use your calories on foods and drinks that will fill you up and not leave you hungry: ? Some examples of foods that fill you up are nuts and nut butters, vegetables, lean proteins, and high-fiber foods like whole grains. High-fiber foods are foods with more than 5 g fiber per serving. ? Drinks such as sodas, specialty coffee drinks, alcohol, and juices have a lot of calories, yet do not fill you up.  Eat nutritious foods and avoid empty calories. Empty calories are calories you get from foods or beverages that do not have many vitamins or protein, such as candy, sweets, and soda. It is better to have a nutritious high-calorie  food (such as an avocado) than a food with few nutrients (such as a bag of chips).  Know how many calories are in the foods you eat most often. This will help you calculate calorie counts faster.  Pay attention to calories in drinks. Low-calorie drinks include water and unsweetened drinks.  Pay attention to nutrition labels for "low fat" or "fat free" foods. These foods sometimes have the same amount of calories or more calories than the full fat versions. They also often have added sugar, starch, or salt, to make up for flavor that was removed with the fat.  Find a way of tracking calories that works for you. Get creative. Try different apps or programs if writing down calories does not work for you. What are some portion control tips?  Know how many calories are in a serving. This will help you know how many servings of a certain food you can have.  Use a measuring cup to measure serving sizes. You could also try weighing out portions on a kitchen scale. With time, you will be able to estimate serving sizes for some foods.  Take some time to put servings of different foods on your favorite plates, bowls, and cups so you know what a serving looks like.  Try not to eat straight from a bag or box. Doing this can lead to overeating. Put the amount you would like to eat in a cup or on a plate to make sure you are eating the right portion.  Use smaller plates, glasses, and bowls to prevent overeating.  Try not to multitask (for example, watch TV or use your computer) while eating. If it is time to eat, sit down at a table and enjoy your food. This will help you to know when you are full. It will also help you to be aware of what you are eating and how much you are eating. What are tips for following this plan? Reading food labels  Check the calorie count compared to the serving size. The serving size may be smaller than what you are used to eating.  Check the source of the calories. Make sure  the food you are eating is high in vitamins and protein and low in saturated and trans fats. Shopping  Read nutrition labels while you shop. This will help you make healthy decisions before you decide to purchase  your food.  Make a grocery list and stick to it. Cooking  Try to cook your favorite foods in a healthier way. For example, try baking instead of frying.  Use low-fat dairy products. Meal planning  Use more fruits and vegetables. Half of your plate should be fruits and vegetables.  Include lean proteins like poultry and fish. How do I count calories when eating out?  Ask for smaller portion sizes.  Consider sharing an entree and sides instead of getting your own entree.  If you get your own entree, eat only half. Ask for a box at the beginning of your meal and put the rest of your entree in it so you are not tempted to eat it.  If calories are listed on the menu, choose the lower calorie options.  Choose dishes that include vegetables, fruits, whole grains, low-fat dairy products, and lean protein.  Choose items that are boiled, broiled, grilled, or steamed. Stay away from items that are buttered, battered, fried, or served with cream sauce. Items labeled "crispy" are usually fried, unless stated otherwise.  Choose water, low-fat milk, unsweetened iced tea, or other drinks without added sugar. If you want an alcoholic beverage, choose a lower calorie option such as a glass of wine or light beer.  Ask for dressings, sauces, and syrups on the side. These are usually high in calories, so you should limit the amount you eat.  If you want a salad, choose a garden salad and ask for grilled meats. Avoid extra toppings like bacon, cheese, or fried items. Ask for the dressing on the side, or ask for olive oil and vinegar or lemon to use as dressing.  Estimate how many servings of a food you are given. For example, a serving of cooked rice is  cup or about the size of half a  baseball. Knowing serving sizes will help you be aware of how much food you are eating at restaurants. The list below tells you how big or small some common portion sizes are based on everyday objects: ? 1 oz-4 stacked dice. ? 3 oz-1 deck of cards. ? 1 tsp-1 die. ? 1 Tbsp- a ping-pong ball. ? 2 Tbsp-1 ping-pong ball. ?  cup- baseball. ? 1 cup-1 baseball. Summary  Calorie counting means keeping track of how many calories you eat and drink each day. If you eat fewer calories than your body needs, you should lose weight.  A healthy amount of weight to lose per week is usually 1-2 lb (0.5-0.9 kg). This usually means reducing your daily calorie intake by 500-750 calories.  The number of calories in a food can be found on a Nutrition Facts label. If a food does not have a Nutrition Facts label, try to look up the calories online or ask your dietitian for help.  Use your calories on foods and drinks that will fill you up, and not on foods and drinks that will leave you hungry.  Use smaller plates, glasses, and bowls to prevent overeating. This information is not intended to replace advice given to you by your health care provider. Make sure you discuss any questions you have with your health care provider. Document Released: 02/11/2005 Document Revised: 01/12/2016 Document Reviewed: 01/12/2016 Elsevier Interactive Patient Education  2017 [employer].

## 2016-12-05 NOTE — Addendum Note (Signed)
Addended by: Thurnell Garbe A on: 12/05/2016 09:46 AM   Modules accepted: Orders

## 2016-12-10 LAB — PAP, TP IMAGING W/ HPV RNA, RFLX HPV TYPE 16,18/45: HPV DNA High Risk: NOT DETECTED

## 2016-12-17 ENCOUNTER — Telehealth: Payer: Self-pay | Admitting: *Deleted

## 2016-12-17 ENCOUNTER — Encounter: Payer: Self-pay | Admitting: *Deleted

## 2016-12-17 NOTE — Telephone Encounter (Signed)
Patient called requesting a note for work stating she had annual on 12/05/16. Note printed pt will come pick up

## 2017-06-05 ENCOUNTER — Encounter: Payer: Self-pay | Admitting: Family Medicine

## 2017-06-05 ENCOUNTER — Ambulatory Visit (INDEPENDENT_AMBULATORY_CARE_PROVIDER_SITE_OTHER): Admitting: Family Medicine

## 2017-06-05 VITALS — BP 140/88 | HR 76 | Temp 97.7°F | Ht 65.5 in | Wt 232.0 lb

## 2017-06-05 DIAGNOSIS — R109 Unspecified abdominal pain: Secondary | ICD-10-CM

## 2017-06-05 DIAGNOSIS — J302 Other seasonal allergic rhinitis: Secondary | ICD-10-CM | POA: Diagnosis not present

## 2017-06-05 DIAGNOSIS — K648 Other hemorrhoids: Secondary | ICD-10-CM | POA: Diagnosis not present

## 2017-06-05 DIAGNOSIS — M546 Pain in thoracic spine: Secondary | ICD-10-CM

## 2017-06-05 DIAGNOSIS — J45901 Unspecified asthma with (acute) exacerbation: Secondary | ICD-10-CM | POA: Diagnosis not present

## 2017-06-05 DIAGNOSIS — K5900 Constipation, unspecified: Secondary | ICD-10-CM

## 2017-06-05 LAB — POCT URINALYSIS DIP (PROADVANTAGE DEVICE)
Bilirubin, UA: NEGATIVE
Blood, UA: NEGATIVE
Glucose, UA: NEGATIVE mg/dL
Ketones, POC UA: NEGATIVE mg/dL
Leukocytes, UA: NEGATIVE
Nitrite, UA: NEGATIVE
Protein Ur, POC: NEGATIVE mg/dL
Specific Gravity, Urine: 1.015
Urobilinogen, Ur: NEGATIVE
pH, UA: 7.5 (ref 5.0–8.0)

## 2017-06-05 MED ORDER — ALBUTEROL SULFATE (2.5 MG/3ML) 0.083% IN NEBU
2.5000 mg | INHALATION_SOLUTION | Freq: Once | RESPIRATORY_TRACT | Status: AC
  Administered 2017-06-05: 2.5 mg via RESPIRATORY_TRACT

## 2017-06-05 MED ORDER — HYDROCORTISONE ACETATE 25 MG RE SUPP: 25.0000 mg | Freq: Two times a day (BID) | RECTAL | 0 refills | Status: DC | PRN

## 2017-06-05 MED ORDER — PREDNISONE 20 MG PO TABS: 20.0000 mg | ORAL_TABLET | Freq: Two times a day (BID) | ORAL | 0 refills | Status: DC

## 2017-06-05 NOTE — Progress Notes (Signed)
Chief Complaint  Patient presents with  . Back Pain    low back pain and abd pain. Also constipation x 3 weeks. Has tried mag citrate but not really helped-feels like something might be stuck in her rectum.     She had been drinking TLC detox tea, to help with bowels, lose weight. She had been drinking that for 2 weeks, and when she stopped it three weeks ago, she got constipated.  Was passing small amounts, not completely emptying.  She felt a bulge on her L abdomen. She used yoga ball, and that seemed to move it down, felt it move down to her anus.  Took Mag Citrate 3/25, broke up some of the stool load, but didn't completely empty.  Felt like she still had urge to defectae, nothing came She has been taking miralax 1 capful BID for about 3 weeks. She continues to drink a lot of water.  She used glycerin suppository, had pressure in her rectum, couldn't hold it long enough. Still feels pressure in the anus, like she has to defecate but can't. Last BM was this morning--soft, good amount.  Denies any blood in her stool.  2 days ago she started with left side pain (starts at LUQ, moves into the back/flank area, into lower back) Hurt to bend, push cart yesterday at work. She took ibuprofen, aleve, nothing took the edge off.  Biofreeze didn't help much.  She suffers from allergies and asthma.  Her cough got worse when in [place] last week.  She has felt wheezy over the last few days, some thick green mucus.  These cold symptoms started about 5-6 days ago. She last used albuterol a few days ago. She is waiting for more to arrive from mail order.  She has been taking sudafed and mucinex, which has helped some. Mucus is thin, is able to get it up. No fever or sinus pain. She is using Breo regularly.  Needs albuterol infrequently overall (needed last week when in [place], sometimes when allergies flare, needed last night). She uses xyzal and astelin daily and is under the care of an allergist.  She is in the  process of starting allergy shots (had testing).  PMH, PSH, SH reviewed  Outpatient Encounter Medications as of 06/05/2017  Medication Sig Note  . azelastine (ASTELIN) 0.1 % nasal spray Place 2 sprays into both nostrils 2 (two) times daily. Use in each nostril as directed   . Fluticasone Furoate-Vilanterol (BREO ELLIPTA) 100-25 MCG/INH AEPB Inhale 1 puff into the lungs daily.   Marland Kitchen levocetirizine (XYZAL) 5 MG tablet Take 5 mg by mouth every evening.   Marland Kitchen albuterol (PROVENTIL HFA;VENTOLIN HFA) 108 (90 BASE) MCG/ACT inhaler 2 puffs q 4 hrs-(PT NEEDS PROVENTIL NO VENTOLIN) (Patient not taking: Reported on 06/05/2017)   . diphenhydrAMINE (COMPLETE ALLERGY RELIEF) 25 MG tablet Take 25 mg by mouth every 6 (six) hours as needed. 03/28/2014: Uses prn for trouble sleeping; uses sporadically  . hydrocortisone (ANUSOL-HC) 25 MG suppository Place 1 suppository (25 mg total) rectally 2 (two) times daily as needed for hemorrhoids or anal itching.   Marland Kitchen omeprazole (PRILOSEC) 20 MG capsule Take 20 mg by mouth daily as needed (for Reflux). 03/28/2014: Taking once daily, just prn  . predniSONE (DELTASONE) 20 MG tablet Take 1 tablet (20 mg total) by mouth 2 (two) times daily with a meal.   . valACYclovir (VALTREX) 500 MG tablet Take 1 tablet (500 mg total) by mouth daily. (Patient not taking: Reported on 06/05/2017)   . [  EXPIRED] albuterol (PROVENTIL) (2.5 MG/3ML) 0.083% nebulizer solution 2.5 mg     No facility-administered encounter medications on file as of 06/05/2017.    (prednisone and anusol rx'd today, not taking prior to visit).  No Known Allergies  ROS: No fever, chills, nausea, vomiting. +allergies/asthma per HPI. No urinary symptoms. Denies vaginal discharge.  Recently had cycle, denies pregnancy. No recent reflux, only uses prilosec prn (not recently). +coughing, allergies.  See HPI  PHYSICAL EXAM:  BP 140/88   Pulse 76   Temp 97.7 F (36.5 C) (Tympanic)   Ht 5' 5.5" (1.664 m)   Wt 232 lb (105.2 kg)    LMP 05/25/2017 (Exact Date)   BMI 38.02 kg/m   Well appearing, pleasant female, in mild discomfort with certain movements. She is speaking easily, in no distress HEENT: PERRL, EOMI, conjunctiva and sclera are clear.  Nasal mucosa is normal. Sinuses nontender. OP is clear Neck: no lymphadenopathy or mass Lungs: Diffuse expiratory wheezing.  After nebulizer treatment, wheezing was diminished, though still present.  No rales, ronchi. Abdomen: minimal tenderness LLQ, no rebound, guarding, mass. Normal bowel sounds Back: no spinal or CVA tenderness. Tender at inferior ribs laterally on the left, and over floating ribs. No muscle spasm Rectal:  No external hemorrhoids.  Normal sphincter tone, no mass. Tender on rectal exam over the palpable internal hemorrhoids. No mass, clot.  Heme negative soft stool.  Minimal stool in vault.  Urine dip normal. SG 1.015    ASSESSMENT/PLAN:  Acute left-sided thoracic back pain - musculoskeletal, worsened with coughing. heat, topicals, treating cough. no work today. Prednisone for wheezing should help  Abdominal pain, unspecified abdominal location - minimal LLQ, improved with treatment of constipation. no e/o infection or other underlying pathology. MSK component to her side pain - Plan: POCT Urinalysis DIP (Proadvantage Device)  Exacerbation of asthma, unspecified asthma severity, unspecified whether persistent - declined albuterol refill. given neb, felt better, still wheezing. 5d course of prednisone, risks/side effects reviewed in detail - Plan: albuterol (PROVENTIL) (2.5 MG/3ML) 0.083% nebulizer solution 2.5 mg, predniSONE (DELTASONE) 20 MG tablet  Internal hemorrhoids - Anusol HC suppositories BID prn. no evidence of significant ongoing constipation. Cont miralax, titrate as needed - Plan: hydrocortisone (ANUSOL-HC) 25 MG suppository  Seasonal allergic rhinitis, unspecified trigger - continue current meds and f/u with allergist. Short course of prednisone  to help with flare of asthma/allergies (mostly the wheezing)  Constipation, unspecified constipation type - seems to have resolved with use of Miralax. To titrate dose, likely fine with once daily. Cont high fiber diet, hydration   Continue to stay well hydrated. Continue the miralax (you can back down to once daily if stools are too loose or too frequent). Continue a high fiber diet. Your anal pressure/discomfort is related to internal hemorrhoids. Use the prescription Anusol HC suppository to help with this discomfort.  Your lungs were very tight/wheezy today.  Likely a combination of allergy flare and a viral illness.  Take prednisone twice daily for 5 days. This may also help with your pain.  Your pain seems to be musculoskeletal, not related to your bowels or constipation.  There is any muscle spasm, but the pain is likely exacerbated by your coughing.  Because the pain is muscular, your bending/twisting/lifting hurts more.  Take today off, and use a heating pad. You can also try topical patches to see if they help. While on prednisone, avoid ibuprofen and aleve.  Use extra strength tylenol as needed for pain.  Be sure to  follow up with your allergist if your allergies and breathing aren't improving.

## 2017-06-05 NOTE — Patient Instructions (Signed)
Continue to stay well hydrated. Continue the miralax (you can back down to once daily if stools are too loose or too frequent). Continue a high fiber diet. Your anal pressure/discomfort is related to internal hemorrhoids. Use the prescription Anusol HC suppository to help with this discomfort.  Your lungs were very tight/wheezy today.  Likely a combination of allergy flare and a viral illness.  Take prednisone twice daily for 5 days. This may also help with your pain.  Your pain seems to be musculoskeletal, not related to your bowels or constipation.  There is any muscle spasm, but the pain is likely exacerbated by your coughing.  Because the pain is muscular, your bending/twisting/lifting hurts more.  Take today off, and use a heating pad. You can also try topical patches to see if they help. While on prednisone, avoid ibuprofen and aleve.  Use extra strength tylenol as needed for pain.  Be sure to follow up with your allergist if your allergies and breathing aren't improving.   Hemorrhoids Hemorrhoids are swollen veins in and around the rectum or anus. There are two types of hemorrhoids:  Internal hemorrhoids. These occur in the veins that are just inside the rectum. They may poke through to the outside and become irritated and painful.  External hemorrhoids. These occur in the veins that are outside of the anus and can be felt as a painful swelling or hard lump near the anus.  Most hemorrhoids do not cause serious problems, and they can be managed with home treatments such as diet and lifestyle changes. If home treatments do not help your symptoms, procedures can be done to shrink or remove the hemorrhoids. What are the causes? This condition is caused by increased pressure in the anal area. This pressure may result from various things, including:  Constipation.  Straining to have a bowel movement.  Diarrhea.  Pregnancy.  Obesity.  Sitting for long periods of time.  Heavy  lifting or other activity that causes you to strain.  Anal sex.  What are the signs or symptoms? Symptoms of this condition include:  Pain.  Anal itching or irritation.  Rectal bleeding.  Leakage of stool (feces).  Anal swelling.  One or more lumps around the anus.  How is this diagnosed? This condition can often be diagnosed through a visual exam. Other exams or tests may also be done, such as:  Examination of the rectal area with a gloved hand (digital rectal exam).  Examination of the anal canal using a small tube (anoscope).  A blood test, if you have lost a significant amount of blood.  A test to look inside the colon (sigmoidoscopy or colonoscopy).  How is this treated? This condition can usually be treated at home. However, various procedures may be done if dietary changes, lifestyle changes, and other home treatments do not help your symptoms. These procedures can help make the hemorrhoids smaller or remove them completely. Some of these procedures involve surgery, and others do not. Common procedures include:  Rubber band ligation. Rubber bands are placed at the base of the hemorrhoids to cut off the blood supply to them.  Sclerotherapy. Medicine is injected into the hemorrhoids to shrink them.  Infrared coagulation. A type of light energy is used to get rid of the hemorrhoids.  Hemorrhoidectomy surgery. The hemorrhoids are surgically removed, and the veins that supply them are tied off.  Stapled hemorrhoidopexy surgery. A circular stapling device is used to remove the hemorrhoids and use staples to cut off  the blood supply to them.  Follow these instructions at home: Eating and drinking  Eat foods that have a lot of fiber in them, such as whole grains, beans, nuts, fruits, and vegetables. Ask your health care provider about taking products that have added fiber (fiber supplements).  Drink enough fluid to keep your urine clear or pale yellow. Managing pain  and swelling  Take warm sitz baths for 20 minutes, 3-4 times a day to ease pain and discomfort.  If directed, apply ice to the affected area. Using ice packs between sitz baths may be helpful. ? Put ice in a plastic bag. ? Place a towel between your skin and the bag. ? Leave the ice on for 20 minutes, 2-3 times a day. General instructions  Take over-the-counter and prescription medicines only as told by your health care provider.  Use medicated creams or suppositories as told.  Exercise regularly.  Go to the bathroom when you have the urge to have a bowel movement. Do not wait.  Avoid straining to have bowel movements.  Keep the anal area dry and clean. Use wet toilet paper or moist towelettes after a bowel movement.  Do not sit on the toilet for long periods of time. This increases blood pooling and pain. Contact a health care provider if:  You have increasing pain and swelling that are not controlled by treatment or medicine.  You have uncontrolled bleeding.  You have difficulty having a bowel movement, or you are unable to have a bowel movement.  You have pain or inflammation outside the area of the hemorrhoids. This information is not intended to replace advice given to you by your health care provider. Make sure you discuss any questions you have with your health care provider. Document Released: 02/09/2000 Document Revised: 07/12/2015 Document Reviewed: 10/26/2014 Elsevier Interactive Patient Education  [employer].

## 2017-06-06 ENCOUNTER — Telehealth: Payer: Self-pay | Admitting: Family Medicine

## 2017-06-06 MED ORDER — TRAMADOL HCL 50 MG PO TABS: 50.0000 mg | ORAL_TABLET | Freq: Three times a day (TID) | ORAL | 0 refills | Status: DC | PRN

## 2017-06-06 NOTE — Telephone Encounter (Signed)
Called and informed pt of instructions,  And that RX was sent into the pharmacy

## 2017-06-06 NOTE — Telephone Encounter (Signed)
Advise pt that I sent in a prescription for tramadol.  This is a strong pain medication, that potentially can make her sleepy (not sure if she has ever taken it), so to use caution if she needs to work/drive.  It is fine to take at bedtime, so that she doesn't have the pain interfering with her sleep. If taking at bedtime, and 1 isn't effective, she can try two, as a maximum dose (only at bedtime, when she has 8 hours before she needs to drive). She should continue to take the prednisone that was prescribed (for her asthma, but also works as an anti-inflammatory), avoid aleve/ibuprofen.  She may still take tylenol along with the tramadol, if needed.

## 2017-06-06 NOTE — Telephone Encounter (Signed)
Pt called and and is requesting something for pain, states she is still having real bad pain in her left side going to her back, she was not able to sleep last night, she could not sleep on her side or her back because it was so bad, she tried tylenol and the bio freeze but it did not help. Pt uses  [facility] [place], [place] pt can be reached at [phone] I will be leaving at 12.30 today please forward to [facility] if it is after that

## 2017-08-07 ENCOUNTER — Ambulatory Visit: Admitting: Podiatry

## 2017-08-14 ENCOUNTER — Ambulatory Visit (INDEPENDENT_AMBULATORY_CARE_PROVIDER_SITE_OTHER): Admitting: Podiatry

## 2017-08-14 ENCOUNTER — Encounter: Payer: Self-pay | Admitting: Podiatry

## 2017-08-14 ENCOUNTER — Ambulatory Visit (INDEPENDENT_AMBULATORY_CARE_PROVIDER_SITE_OTHER)

## 2017-08-14 ENCOUNTER — Encounter

## 2017-08-14 VITALS — BP 114/82 | HR 93 | Resp 16

## 2017-08-14 DIAGNOSIS — M722 Plantar fascial fibromatosis: Secondary | ICD-10-CM | POA: Diagnosis not present

## 2017-08-14 DIAGNOSIS — S9002XA Contusion of left ankle, initial encounter: Secondary | ICD-10-CM

## 2017-08-14 MED ORDER — METHYLPREDNISOLONE 4 MG PO TBPK: ORAL_TABLET | ORAL | 0 refills | Status: DC

## 2017-08-14 MED ORDER — MELOXICAM 15 MG PO TABS: 15.0000 mg | ORAL_TABLET | Freq: Every day | ORAL | 3 refills | Status: DC

## 2017-08-14 NOTE — Progress Notes (Signed)
Subjective:  Patient ID: GLEN BLATCHLEY, female    DOB: 02-27-1980,  MRN: [id number] HPI Chief Complaint  Patient presents with  . 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 Nail Problem    Questions re: nail fungus - took 60 days lamisil in the past-no help  . New Patient (Initial Visit)    37 y.o. female presents with the above complaint.   ROS: Denies fever chills nausea vomiting muscle aches pains calf pain back pain chest pain shortness of breath headache.  Past Medical History:  Diagnosis Date  . ADD (attention deficit disorder) 10/2014   psych-Dr. Enedina Finner  . Allergy    grass pollen,dust mites, dog,cat,molds  . Asthma   . Genital herpes   . Smoker    former (quit 04/2012)   Past Surgical History:  Procedure Laterality Date  . CERVICAL FUSION  2001  . DILATATION & CURRETTAGE/HYSTEROSCOPY WITH RESECTOCOPE N/A 03/02/2013   Procedure: DILATATION & CURETTAGE/HYSTEROSCOPY WITH RESECTOCOPE;  Surgeon: [name], MD;  Location: [facility];  Service: Gynecology;  Laterality: N/A;  removal of submucosal myoma  . IM NAILING HUMERUS  2001   [facility]    Current Outpatient Medications:  .  albuterol (PROVENTIL HFA;VENTOLIN HFA) 108 (90 BASE) MCG/ACT inhaler, 2 puffs q 4 hrs-(PT NEEDS PROVENTIL NO VENTOLIN) (Patient not taking: Reported on 06/05/2017), Disp: 1 Inhaler, Rfl: 0 .  Fluticasone Furoate-Vilanterol (BREO ELLIPTA) 100-25 MCG/INH AEPB, Inhale 1 puff into the lungs daily., Disp: , Rfl:  .  levocetirizine (XYZAL) 5 MG tablet, Take 5 mg by mouth every evening., Disp: , Rfl:  .  meloxicam (MOBIC) 15 MG tablet, Take 1 tablet (15 mg total) by mouth daily., Disp: 30 tablet, Rfl: 3 .  methylPREDNISolone (MEDROL DOSEPAK) 4 MG TBPK tablet, 6 day dose pack - take as directed, Disp: 21 tablet, Rfl: 0  No  Known Allergies Review of Systems Objective:   Vitals:   08/14/17 0906  BP: 114/82  Pulse: 93  Resp: 16    General: Well developed, nourished, in no acute distress, alert and oriented x3   Dermatological: Skin is warm, dry and supple bilateral. Nails x 10 are well maintained; remaining integument appears unremarkable at this time. There are no open sores, no preulcerative lesions, no rash or signs of infection present.  Vascular: Dorsalis Pedis artery and Posterior Tibial artery pedal pulses are 2/4 bilateral with immedate capillary fill time. Pedal hair growth present. No varicosities and no lower extremity edema present bilateral.   Neruologic: Grossly intact via light touch bilateral. Vibratory intact via tuning fork bilateral. Protective threshold with Semmes Wienstein monofilament intact to all pedal sites bilateral. Patellar and Achilles deep tendon reflexes 2+ bilateral. No Babinski or clonus noted bilateral.   Musculoskeletal: No gross boney pedal deformities bilateral. No pain, crepitus, or limitation noted with foot and ankle range of motion bilateral. Muscular strength 5/5 in all groups tested bilateral.  Gait: Unassisted, Nonantalgic.    Radiographs:  Radiographs taken today demonstrate no ankle deformities however she does have soft tissue increase in density at the plantar fascial calcaneal insertion site. Assessment & Plan:   Assessment: Plantar fasciitis left.  Plan: Discussed etiology pathology conservative versus surgical therapies injected the left heel today with 20 mg Kenalog 5 mg Marcaine medial aspect of the heel after sterile  Betadine skin prep.  Start her on a plantar fascial brace and a night splint.  Discussed appropriate shoe gear stretching exercise ice therapy sugar modifications placed her on Medrol Dosepak to be followed by meloxicam.  Follow-up with her in 1 month.     Keyira Mondesir T. [place], [place]

## 2017-08-14 NOTE — Patient Instructions (Addendum)

## 2017-09-09 ENCOUNTER — Telehealth: Payer: Self-pay | Admitting: *Deleted

## 2017-09-09 NOTE — Telephone Encounter (Signed)
Patient called back ready to proceed with referral to [place] office per note on 12/05/17, referral notes faxed they will call her to schedule. Patient aware as well.

## 2017-09-16 ENCOUNTER — Ambulatory Visit: Admitting: Podiatry

## 2017-09-30 ENCOUNTER — Encounter: Payer: Self-pay | Admitting: *Deleted

## 2017-10-02 ENCOUNTER — Encounter: Payer: Self-pay | Admitting: Podiatry

## 2017-10-02 ENCOUNTER — Ambulatory Visit (INDEPENDENT_AMBULATORY_CARE_PROVIDER_SITE_OTHER): Admitting: Podiatry

## 2017-10-02 DIAGNOSIS — M722 Plantar fascial fibromatosis: Secondary | ICD-10-CM | POA: Diagnosis not present

## 2017-10-02 NOTE — Progress Notes (Addendum)
Presents today chief complaint of pain to the left heel.  Objective: Vital signs stable alert and oriented x3.  Pulses are palpable.  Neurologic sensorium is intact.  DP reflexes are intact.  Muscle strength is normal symmetrical.  She has pain on palpation medial calcaneal tubercle of the left heel  Assessment: Chronic proximal plantar fasciitis left.  Plan: Discussed etiology pathology and surgical therapies reinjected left heel today with 20 mg Kenalog 5 mg Marcaine point maximal tenderness.  She will continue all other conservative therapies and I will follow-up with her in 1 month.

## 2017-10-30 ENCOUNTER — Ambulatory Visit: Admitting: Podiatry

## 2018-01-08 ENCOUNTER — Telehealth: Payer: Self-pay | Admitting: *Deleted

## 2018-01-08 NOTE — Telephone Encounter (Signed)
Patient informed, transferred to appointment desk to schedule annual exam

## 2018-01-08 NOTE — Telephone Encounter (Signed)
Attempting conception, right?  Overdue for Annual/Gyn visit.  Likely ovulatory or post coital spotting.  Agree with observation.  Schedule visit with Pelvic US if persists/worsens in the coming 2 months.

## 2018-01-08 NOTE — Telephone Encounter (Signed)
Patient called had cycle on 12/25/17-12/29/17, yesterday notes left side cramping,went to bathroom and noticed spotting after wiping. Patient did note she has been having sexual intercourse the last 3 days during ovulation. Still notes bright red spotting this am, still has left side cramping taking OTC medication to help. I suggested she watch for now and if spotting continues office visit. Patient was concerned this has never happened before. Please advise

## 2018-02-02 ENCOUNTER — Telehealth: Payer: Self-pay | Admitting: *Deleted

## 2018-02-02 NOTE — Telephone Encounter (Signed)
Patient called c/o misses cycle, has cycle last month, attempting conception, took UPT and negative. Patient said 02/04/18 will be 2 weeks, I told her if no cycle by this date to schedule visit with provider.

## 2018-04-16 ENCOUNTER — Encounter: Admitting: Obstetrics & Gynecology

## 2018-04-22 ENCOUNTER — Encounter: Admitting: Obstetrics & Gynecology

## 2018-06-01 ENCOUNTER — Other Ambulatory Visit: Payer: Self-pay

## 2018-06-02 ENCOUNTER — Encounter: Payer: Self-pay | Admitting: Obstetrics & Gynecology

## 2018-06-02 ENCOUNTER — Ambulatory Visit (INDEPENDENT_AMBULATORY_CARE_PROVIDER_SITE_OTHER): Admitting: Obstetrics & Gynecology

## 2018-06-02 VITALS — BP 118/76 | Ht 65.0 in | Wt 223.0 lb

## 2018-06-02 DIAGNOSIS — N9089 Other specified noninflammatory disorders of vulva and perineum: Secondary | ICD-10-CM

## 2018-06-02 DIAGNOSIS — N979 Female infertility, unspecified: Secondary | ICD-10-CM

## 2018-06-02 DIAGNOSIS — Z01419 Encounter for gynecological examination (general) (routine) without abnormal findings: Secondary | ICD-10-CM | POA: Diagnosis not present

## 2018-06-02 DIAGNOSIS — Z6837 Body mass index (BMI) 37.0-37.9, adult: Secondary | ICD-10-CM

## 2018-06-02 DIAGNOSIS — E6609 Other obesity due to excess calories: Secondary | ICD-10-CM

## 2018-06-02 DIAGNOSIS — Z8619 Personal history of other infectious and parasitic diseases: Secondary | ICD-10-CM

## 2018-06-02 DIAGNOSIS — B3731 Acute candidiasis of vulva and vagina: Secondary | ICD-10-CM

## 2018-06-02 DIAGNOSIS — B373 Candidiasis of vulva and vagina: Secondary | ICD-10-CM | POA: Diagnosis not present

## 2018-06-02 LAB — WET PREP FOR TRICH, YEAST, CLUE

## 2018-06-02 MED ORDER — CLOBETASOL PROPIONATE 0.05 % EX OINT: 1.0000 "application " | TOPICAL_OINTMENT | Freq: Two times a day (BID) | CUTANEOUS | 1 refills | Status: AC

## 2018-06-02 MED ORDER — FLUCONAZOLE 150 MG PO TABS: 150.0000 mg | ORAL_TABLET | Freq: Every day | ORAL | 2 refills | Status: AC

## 2018-06-02 MED ORDER — VALACYCLOVIR HCL 500 MG PO TABS: 500.0000 mg | ORAL_TABLET | Freq: Every day | ORAL | 4 refills | Status: DC

## 2018-06-02 NOTE — Progress Notes (Signed)
Tonya Perkins 1980-10-03 [id number]   History:    38 y.o. G0 Married  RP:  Established patient presenting for annual gyn exam   HPI: Menses regular normal every month.  No BTB.  No Pelvic pain.  C/O vulvar irritation x a few days.  No pain with IC.  Breast normal.  Urine and bowel movements normal.  Body mass index 37.11.  Needs to exercise more.  Health labs with family physician.   Past medical history,surgical history, family history and social history were all reviewed and documented in the EPIC chart.  Gynecologic History Patient's last menstrual period was 05/15/2018. Contraception: Declines, h/o Infertility Last Pap: 11/2016. Results were: Negative Last mammogram: Never Bone Density: Never Colonoscopy: Never  Obstetric History OB History  Gravida Para Term Preterm AB Living  0 0 0 0 0 0  SAB TAB Ectopic Multiple Live Births  0 0 0 0 0     ROS: A ROS was performed and pertinent positives and negatives are included in the history.  GENERAL: No fevers or chills. HEENT: No change in vision, no earache, sore throat or sinus congestion. NECK: No pain or stiffness. CARDIOVASCULAR: No chest pain or pressure. No palpitations. PULMONARY: No shortness of breath, cough or wheeze. GASTROINTESTINAL: No abdominal pain, nausea, vomiting or diarrhea, melena or bright red blood per rectum. GENITOURINARY: No urinary frequency, urgency, hesitancy or dysuria. MUSCULOSKELETAL: No joint or muscle pain, no back pain, no recent trauma. DERMATOLOGIC: No rash, no itching, no lesions. ENDOCRINE: No polyuria, polydipsia, no heat or cold intolerance. No recent change in weight. HEMATOLOGICAL: No anemia or easy bruising or bleeding. NEUROLOGIC: No headache, seizures, numbness, tingling or weakness. PSYCHIATRIC: No depression, no loss of interest in normal activity or change in sleep pattern.     Exam:   BP 118/76   Ht 5\' 5"  (1.651 m)   Wt 223 lb (101.2 kg)   LMP 05/15/2018   BMI 37.11 kg/m    Body mass index is 37.11 kg/m.  General appearance : Well developed well nourished female. No acute distress HEENT: Eyes: no retinal hemorrhage or exudates,  Neck supple, trachea midline, no carotid bruits, no thyroidmegaly Lungs: Clear to auscultation, no rhonchi or wheezes, or rib retractions  Heart: Regular rate and rhythm, no murmurs or gallops Breast:Examined in sitting and supine position were symmetrical in appearance, no palpable masses or tenderness,  no skin retraction, no nipple inversion, no nipple discharge, no skin discoloration, no axillary or supraclavicular lymphadenopathy Abdomen: no palpable masses or tenderness, no rebound or guarding Extremities: no edema or skin discoloration or tenderness  Pelvic: Vulva: Erythema with irritation.               Vagina: No gross lesions.  Increased discharge.  Wet prep done.  Cervix: No gross lesions or discharge  Uterus  AV, normal size, shape and consistency, non-tender and mobile  Adnexa  Without masses or tenderness  Anus: Notrmal  Wet prep:  Yeasts present   Assessment/Plan:  38 y.o. female for annual exam   1. Well female exam with routine gynecological exam Gynecologic exam with yeast vaginitis.  Pap test October 2018 was negative, no indication to repeat this year.  Breast exam normal.  Health labs with family physician.  2. Primary female infertility Declines contraception.  3. Vulvar irritation Vulvitis probably secondary to yeast vaginitis.  Will treat the yeast vaginitis with fluconazole and patient will apply clobetasol ointment for the vulvitis.  Prescriptions sent to pharmacy.  4. Yeast vaginitis We will treat with fluconazole 150 mg 1 tab daily x 3.  Usage reviewed and prescription sent to pharmacy.  Recommend Probiotic tablet vaginally weekly for prevention.  5. H/O herpes genitalis Decision to start on Valacyclovir prophylaxis 500 mg daily.  Prescription sent to pharmacy.  6. Class 2 obesity due to  excess calories without serious comorbidity with body mass index (BMI) of 37.0 to 37.9 in adult Recommend low Calorie/Carb diet such as [employer].  Intermittent fasting recommended.  Aerobic physical activities 5 times a week and weight lifting every 2 days.  Other orders - fluconazole (DIFLUCAN) 150 MG tablet; Take 1 tablet (150 mg total) by mouth daily for 3 days. - clobetasol ointment (TEMOVATE) 0.05 %; Apply 1 application topically 2 (two) times daily for 14 days. Thin application on affected vulva - valACYclovir (VALTREX) 500 MG tablet; Take 1 tablet (500 mg total) by mouth daily. Prophylaxis  Counseling on above issues and coordination of care >50% x 10 minutes.  [name] MD, 9:49 AM 06/02/2018

## 2018-06-02 NOTE — Patient Instructions (Signed)
1. Well female exam with routine gynecological exam Gynecologic exam with yeast vaginitis.  Pap test October 2018 was negative, no indication to repeat this year.  Breast exam normal.  Health labs with family physician.  2. Primary female infertility Declines contraception.  3. Vulvar irritation Vulvitis probably secondary to yeast vaginitis.  Will treat the yeast vaginitis with fluconazole and patient will apply clobetasol ointment for the vulvitis.  Prescriptions sent to pharmacy.  4. Yeast vaginitis We will treat with fluconazole 150 mg 1 tab daily x 3.  Usage reviewed and prescription sent to pharmacy.  Recommend Probiotic tablet vaginally weekly for prevention.  5. H/O herpes genitalis Decision to start on Valacyclovir prophylaxis 500 mg daily.  Prescription sent to pharmacy.  6. Class 2 obesity due to excess calories without serious comorbidity with body mass index (BMI) of 37.0 to 37.9 in adult Recommend low Calorie/Carb diet such as [employer].  Intermittent fasting recommended.  Aerobic physical activities 5 times a week and weight lifting every 2 days.  Other orders - fluconazole (DIFLUCAN) 150 MG tablet; Take 1 tablet (150 mg total) by mouth daily for 3 days. - clobetasol ointment (TEMOVATE) 0.05 %; Apply 1 application topically 2 (two) times daily for 14 days. Thin application on affected vulva - valACYclovir (VALTREX) 500 MG tablet; Take 1 tablet (500 mg total) by mouth daily. Prophylaxis  Ryder, it was a pleasure seeing you today!

## 2018-06-25 ENCOUNTER — Encounter: Admitting: Obstetrics & Gynecology

## 2018-07-21 ENCOUNTER — Other Ambulatory Visit: Payer: Self-pay

## 2018-07-21 ENCOUNTER — Ambulatory Visit (INDEPENDENT_AMBULATORY_CARE_PROVIDER_SITE_OTHER): Admitting: Women's Health

## 2018-07-21 ENCOUNTER — Encounter: Payer: Self-pay | Admitting: Women's Health

## 2018-07-21 VITALS — BP 130/80

## 2018-07-21 DIAGNOSIS — Z113 Encounter for screening for infections with a predominantly sexual mode of transmission: Secondary | ICD-10-CM | POA: Diagnosis not present

## 2018-07-21 LAB — WET PREP FOR TRICH, YEAST, CLUE

## 2018-07-21 NOTE — Progress Notes (Signed)
38 year old MBF G0 presents requesting STD screen.  Husband was seen at health department due to clear discharge from penis and diagnosed with non-gonococcal urethritis with instructions to have wife screened.  Denies vaginal discharge, urinary symptoms, abdominal/back pain or fever.  Denies infidelity, states husband has  type 2 diabetes with poor control and issues with ED.  Monthly cycle/history of infertility.  Medical problems include asthma, HSV and obesity.  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  Exam: Appears well.  No CVAT.  Abdomen obese, soft, nontender.  External genitalia within normal limits without erythema, speculum exam scant discharge without odor or erythema noted, wet prep negative, GC/chlamydia culture taken.  Bimanual no CMT or adnexal tenderness.  STD screen  Plan: Reviewed normality of exam, reviewed most likely husband's issues are related to uncontrolled diabetes.  Encouraged to have husband keep follow-up instructions and to follow-up with dietitian.  GC/Chlamydia culture pending.  Husband had HIV, RPR and hepatitis drawn at health department results pending.  Declines need for contraception.

## 2018-07-22 LAB — C. TRACHOMATIS/N. GONORRHOEAE RNA
C. trachomatis RNA, TMA: NOT DETECTED
N. gonorrhoeae RNA, TMA: NOT DETECTED

## 2019-02-10 ENCOUNTER — Telehealth: Payer: Self-pay | Admitting: *Deleted

## 2019-02-10 DIAGNOSIS — N926 Irregular menstruation, unspecified: Secondary | ICD-10-CM

## 2019-02-10 NOTE — Telephone Encounter (Signed)
Patient informed with below note , will have appointment desk call to schedule. Order placed.

## 2019-02-10 NOTE — Telephone Encounter (Signed)
Patient called had cycle on 01/26/19-01/30/19 normal cycle, patient said on Monday 02/08/19 spotting started again and now a flow period again, not heavy, normal flow, history of fibroids per patient, reports having them removed. Patient would like recommendations. Please advise

## 2019-02-10 NOTE — Telephone Encounter (Signed)
Bring in for evaluation with Pelvic US.

## 2019-02-10 NOTE — Telephone Encounter (Signed)
Appointment has been scheduled for January 21 at 11:00 am.

## 2019-02-26 DIAGNOSIS — Z8616 Personal history of COVID-19: Secondary | ICD-10-CM

## 2019-02-26 HISTORY — DX: Personal history of COVID-19: Z86.16

## 2019-03-17 ENCOUNTER — Other Ambulatory Visit: Payer: Self-pay

## 2019-03-18 ENCOUNTER — Ambulatory Visit (INDEPENDENT_AMBULATORY_CARE_PROVIDER_SITE_OTHER)

## 2019-03-18 ENCOUNTER — Ambulatory Visit (INDEPENDENT_AMBULATORY_CARE_PROVIDER_SITE_OTHER): Admitting: Obstetrics & Gynecology

## 2019-03-18 ENCOUNTER — Encounter: Payer: Self-pay | Admitting: Obstetrics & Gynecology

## 2019-03-18 VITALS — BP 130/90

## 2019-03-18 DIAGNOSIS — D219 Benign neoplasm of connective and other soft tissue, unspecified: Secondary | ICD-10-CM | POA: Diagnosis not present

## 2019-03-18 DIAGNOSIS — R300 Dysuria: Secondary | ICD-10-CM | POA: Diagnosis not present

## 2019-03-18 DIAGNOSIS — N926 Irregular menstruation, unspecified: Secondary | ICD-10-CM

## 2019-03-18 DIAGNOSIS — N921 Excessive and frequent menstruation with irregular cycle: Secondary | ICD-10-CM

## 2019-03-18 LAB — URINALYSIS, COMPLETE W/RFL CULTURE
Bacteria, UA: NONE SEEN /HPF
Bilirubin Urine: NEGATIVE
Glucose, UA: NEGATIVE
Hyaline Cast: NONE SEEN /LPF
Ketones, ur: NEGATIVE
Leukocyte Esterase: NEGATIVE
Nitrites, Initial: NEGATIVE
Protein, ur: NEGATIVE
RBC / HPF: NONE SEEN /HPF (ref 0–2)
Specific Gravity, Urine: 1.015 (ref 1.001–1.03)
WBC, UA: NONE SEEN /HPF (ref 0–5)
pH: 7 (ref 5.0–8.0)

## 2019-03-18 LAB — NO CULTURE INDICATED

## 2019-03-18 NOTE — Progress Notes (Signed)
    Tonya Perkins 06/04/1980 [id number]        39 y.o.  G0P0000 Married  RP: Metrorrhagia for Pelvic US  HPI: Attempting conception.  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  Had 3 days of midcycle mild bleeding in 01/2019.  Menses normal and regular otherwise.  No pelvic pain.  Mild pressure with urination.   OB History  Gravida Para Term Preterm AB Living  0 0 0 0 0 0  SAB TAB Ectopic Multiple Live Births  0 0 0 0 0    Past medical history,surgical history, problem list, medications, allergies, family history and social history were all reviewed and documented in the EPIC chart.   Directed ROS with pertinent positives and negatives documented in the history of present illness/assessment and plan.  Exam:  Vitals:   03/18/19 1134  BP: 130/90   General appearance:  Normal  Pelvic US today: T/V images.  Anteverted uterus normal in size and shape with small intramural and subserosal fibroids.  The overall uterine size is measured at 7.82 x 4.48 x 3.65 cm.  The largest fibroid was measured at 1.42 x 1.01 cm.  The endometrial line was symmetrical with no mass or thickening seen, measured at 7.21 mm.  Both ovaries are normal in size with normal follicular pattern.  Simple dominant follicle measured at 2 cm x 1.4 cm was present on the left ovary.  Patient is cycle day 18 today.  No adnexal mass.  No free fluid in the posterior cul-de-sac.  U/A Negative   Assessment/Plan:  39 y.o. G0P0000   1. Metrorrhagia Metrorrhagia x1.  Pelvic ultrasound findings thoroughly reviewed with patient.  Small intramural and subserosal fibroids within normal size uterus overall.  Normal endometrial lining measured at 7.21 mm with no mass or thickening.  Both ovaries are normal with a dominant follicle on the left ovary measured at 2 cm.  Patient reassured about the ultrasound findings.  We will continue to attempt conception.  Probably having ovulatory cycles.  2. Fibroids Intramural and  subserosal fibroids, no submucosal fibroid.  Largest uterine fibroid measured at 1.42 cm.  Patient reassured.  Recommend not to proceed with a myomectomy prior to attempting conception. The risks would be higher than the benefits.  Patient reassured that her private myomectomy was by a hysteroscopy for a submucosal fibroid, therefore her uterine muscle, the myometrium, is intact.  3. Dysuria Urine analysis negative.  Patient reassured. - Urinalysis,Complete w/RFL Culture  [name] MD, 11:57 AM 03/18/2019

## 2019-03-21 ENCOUNTER — Encounter: Payer: Self-pay | Admitting: Obstetrics & Gynecology

## 2019-03-21 NOTE — Patient Instructions (Signed)
1. Metrorrhagia Metrorrhagia x1.  Pelvic ultrasound findings thoroughly reviewed with patient.  Small intramural and subserosal fibroids within normal size uterus overall.  Normal endometrial lining measured at 7.21 mm with no mass or thickening.  Both ovaries are normal with a dominant follicle on the left ovary measured at 2 cm.  Patient reassured about the ultrasound findings.  We will continue to attempt conception.  Probably having ovulatory cycles.  2. Fibroids Intramural and subserosal fibroids, no submucosal fibroid.  Largest uterine fibroid measured at 1.42 cm.  Patient reassured.  Recommend not to proceed with a myomectomy prior to attempting conception. The risks would be higher than the benefits.  Patient reassured that her private myomectomy was by a hysteroscopy for a submucosal fibroid, therefore her uterine muscle, the myometrium, is intact.  3. Dysuria Urine analysis negative.  Patient reassured. - Urinalysis,Complete w/RFL Culture  Tonya Perkins, it was a pleasure seeing you today!

## 2019-03-25 ENCOUNTER — Telehealth: Payer: Self-pay | Admitting: *Deleted

## 2019-03-25 MED ORDER — FLUCONAZOLE 150 MG PO TABS: 150.0000 mg | ORAL_TABLET | Freq: Every day | ORAL | 1 refills | Status: DC

## 2019-03-25 NOTE — Telephone Encounter (Signed)
Patient called c/o vaginal itching and irritation, asked if diflucan tablet can be sent?

## 2019-03-25 NOTE — Telephone Encounter (Signed)
Patient informed. 

## 2019-03-25 NOTE — Telephone Encounter (Signed)
Agree to Fluconazole 150 mg 1 tab daily x 3.  #3, refill x 1.

## 2019-05-19 ENCOUNTER — Ambulatory Visit
Admission: EM | Admit: 2019-05-19 | Discharge: 2019-05-19 | Disposition: A | Discharge status: Home / Self Care | Attending: Physician Assistant | Admitting: Physician Assistant

## 2019-05-19 ENCOUNTER — Telehealth

## 2019-05-19 ENCOUNTER — Ambulatory Visit (INDEPENDENT_AMBULATORY_CARE_PROVIDER_SITE_OTHER)

## 2019-05-19 DIAGNOSIS — M542 Cervicalgia: Secondary | ICD-10-CM

## 2019-05-19 MED ORDER — MELOXICAM 7.5 MG PO TABS: 7.5000 mg | ORAL_TABLET | Freq: Every day | ORAL | 0 refills | Status: DC

## 2019-05-19 MED ORDER — TIZANIDINE HCL 2 MG PO TABS: 2.0000 mg | ORAL_TABLET | Freq: Three times a day (TID) | ORAL | 0 refills | Status: DC | PRN
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
## 2019-06-02 ENCOUNTER — Other Ambulatory Visit: Payer: Self-pay

## 2019-06-03 ENCOUNTER — Ambulatory Visit (INDEPENDENT_AMBULATORY_CARE_PROVIDER_SITE_OTHER): Admitting: Obstetrics & Gynecology

## 2019-06-03 ENCOUNTER — Encounter: Payer: Self-pay | Admitting: Obstetrics & Gynecology

## 2019-06-03 VITALS — BP 126/84 | Ht 65.0 in | Wt 221.0 lb

## 2019-06-03 DIAGNOSIS — Z3169 Encounter for other general counseling and advice on procreation: Secondary | ICD-10-CM | POA: Diagnosis not present

## 2019-06-03 DIAGNOSIS — E6609 Other obesity due to excess calories: Secondary | ICD-10-CM | POA: Diagnosis not present

## 2019-06-03 DIAGNOSIS — Z01419 Encounter for gynecological examination (general) (routine) without abnormal findings: Secondary | ICD-10-CM

## 2019-06-03 DIAGNOSIS — Z6836 Body mass index (BMI) 36.0-36.9, adult: Secondary | ICD-10-CM

## 2019-06-03 DIAGNOSIS — N979 Female infertility, unspecified: Secondary | ICD-10-CM | POA: Diagnosis not present

## 2019-06-03 NOTE — Progress Notes (Addendum)
Tonya Perkins Mar 16, 1980 [id number]   History:    39 y.o. G0 Married.  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.  RP:  Established patient presenting for annual gyn exam   HPI: Menses regular normal every month.  No BTB since presentation 02/2019 when a Pelvic US was done.  Pelvic US 02/2019 Endometrial line normal, 2 small Fibroids SS and IM, normal ovaries.  No Pelvic pain.  No pain with IC. Starting to attempt conception now.  Tried x 1 year in 2019 without success, took Clomid.  Breast normal.  Urine and bowel movements normal.  Body mass index 36.78.  Needs to exercise more.  Health labs with family physician.   Past medical history,surgical history, family history and social history were all reviewed and documented in the EPIC chart.  Gynecologic History Patient's last menstrual period was 05/28/2019.  Obstetric History OB History  Gravida Para Term Preterm AB Living  0 0 0 0 0 0  SAB TAB Ectopic Multiple Live Births  0 0 0 0 0     ROS: A ROS was performed and pertinent positives and negatives are included in the history.  GENERAL: No fevers or chills. HEENT: No change in vision, no earache, sore throat or sinus congestion. NECK: No pain or stiffness. CARDIOVASCULAR: No chest pain or pressure. No palpitations. PULMONARY: No shortness of breath, cough or wheeze. GASTROINTESTINAL: No abdominal pain, nausea, vomiting or diarrhea, melena or bright red blood per rectum. GENITOURINARY: No urinary frequency, urgency, hesitancy or dysuria. MUSCULOSKELETAL: No joint or muscle pain, no back pain, no recent trauma. DERMATOLOGIC: No rash, no itching, no lesions. ENDOCRINE: No polyuria, polydipsia, no heat or cold intolerance. No recent change in weight. HEMATOLOGICAL: No anemia or easy bruising or bleeding. NEUROLOGIC: No headache, seizures, numbness, tingling or weakness. PSYCHIATRIC: No depression, no loss of interest in normal activity or change in sleep pattern.     Exam:   BP  126/84   Ht 5\' 5"  (1.651 m)   Wt 221 lb (100.2 kg)   LMP 05/28/2019   BMI 36.78 kg/m   Body mass index is 36.78 kg/m.  General appearance : Well developed well nourished female. No acute distress HEENT: Eyes: no retinal hemorrhage or exudates,  Neck supple, trachea midline, no carotid bruits, no thyroidmegaly Lungs: Clear to auscultation, no rhonchi or wheezes, or rib retractions  Heart: Regular rate and rhythm, no murmurs or gallops Breast:Examined in sitting and supine position were symmetrical in appearance, no palpable masses or tenderness,  no skin retraction, no nipple inversion, no nipple discharge, no skin discoloration, no axillary or supraclavicular lymphadenopathy Abdomen: no palpable masses or tenderness, no rebound or guarding Extremities: no edema or skin discoloration or tenderness  Pelvic: Vulva: Normal             Vagina: No gross lesions or discharge  Cervix: No gross lesions or discharge.  Pap reflex done.  Uterus  AV, normal size, shape and consistency, non-tender and mobile  Adnexa  Without masses or tenderness  Anus: Normal   Assessment/Plan:  39 y.o. female for annual exam   1. Encounter for routine gynecological examination with Papanicolaou smear of cervix Normal gynecologic exam.  Pap reflex done.  Breast exam normal.  Health labs with family physician.  2. Encounter for preconception consultation History of attempting conception for 1 year previously.  No success after using Clomid.  Menstrual period currently regular.  Will attempt conception for a maximum of 3 months.  If not  successful given advanced maternal age of 58, will follow up to investigate and consider Clomid again.  Recommend weight loss with a lower calorie nutrition plan and increase physical activities.  Prenatal vitamins to start.  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  No success after using Clomid.  Probable oligoovulation at that time no  other cause of infertility found.  We will follow-up at 3 months if not successful given her age of 56.  Will investigate and consider Clomid.  Referral to a fertility specialist suggested.  4. Class 2 obesity due to excess calories without serious comorbidity with body mass index (BMI) of 36.0 to 36.9 in adult Lower calorie diet recommended.  Aerobic activities 5 times a week and light weightlifting every 2 days.  [name] MD, 9:01 AM 06/03/2019

## 2019-06-04 LAB — PAP IG W/ RFLX HPV ASCU

## 2019-06-09 ENCOUNTER — Encounter: Payer: Self-pay | Admitting: Obstetrics & Gynecology

## 2019-06-09 NOTE — Patient Instructions (Signed)
1. Encounter for routine gynecological examination with Papanicolaou smear of cervix Normal gynecologic exam.  Pap reflex done.  Breast exam normal.  Health labs with family physician.  2. Encounter for preconception consultation History of attempting conception for 1 year previously.  No success after using Clomid.  Menstrual period currently regular.  Will attempt conception for a maximum of 3 months.  If not successful given advanced maternal age of 28, will follow up to investigate and consider Clomid again.  Recommend weight loss with a lower calorie nutrition plan and increase physical activities.  Prenatal vitamins to start.  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  No success after using Clomid.  Probable oligoovulation at that time no other cause of infertility found.  We will follow-up at 3 months if not successful given her age of 87.  Will investigate and consider Clomid.  Referral to a fertility specialist suggested.  4. Class 2 obesity due to excess calories without serious comorbidity with body mass index (BMI) of 36.0 to 36.9 in adult Lower calorie diet recommended.  Aerobic activities 5 times a week and light weightlifting every 2 days.  Locklynn, it was a pleasure seeing you today!  I will inform you of your results as soon as they are available.

## 2019-06-28 ENCOUNTER — Other Ambulatory Visit: Payer: Self-pay

## 2019-06-28 ENCOUNTER — Encounter: Payer: Self-pay | Admitting: Obstetrics & Gynecology

## 2019-06-28 ENCOUNTER — Ambulatory Visit (INDEPENDENT_AMBULATORY_CARE_PROVIDER_SITE_OTHER): Admitting: Obstetrics & Gynecology

## 2019-06-28 VITALS — BP 132/86

## 2019-06-28 DIAGNOSIS — N760 Acute vaginitis: Secondary | ICD-10-CM | POA: Diagnosis not present

## 2019-06-28 DIAGNOSIS — N898 Other specified noninflammatory disorders of vagina: Secondary | ICD-10-CM

## 2019-06-28 DIAGNOSIS — B9689 Other specified bacterial agents as the cause of diseases classified elsewhere: Secondary | ICD-10-CM

## 2019-06-28 LAB — WET PREP FOR TRICH, YEAST, CLUE

## 2019-06-28 MED ORDER — TINIDAZOLE 500 MG PO TABS: 1000.0000 mg | ORAL_TABLET | Freq: Two times a day (BID) | ORAL | 0 refills | Status: AC

## 2019-06-28 NOTE — Patient Instructions (Signed)
1. Vaginal odor Wet prep confirming bacterial vaginosis.  Rule out gonorrhea and chlamydia.  No contraindication to to need this all for BV treatment.  Usage reviewed.  Prescription sent to pharmacy. - WET PREP FOR TRICH, YEAST, CLUE - C. trachomatis/N. gonorrhoeae RNA  Other orders - tinidazole (TINDAMAX) 500 MG tablet; Take 2 tablets (1,000 mg total) by mouth 2 (two) times daily for 2 days.  Ruie, it was a pleasure seeing you today!

## 2019-06-28 NOTE — Progress Notes (Signed)
    Tonya Perkins [birth year] [id number]        39 y.o.  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   RP: Vaginal odor after IC  HPI: Worsening vaginal odor after intercourse since early April.  No history of bacterial vaginosis.  No pelvic pain.  No fever.  Menses regular normal.  No contraception, would like to conceive.   OB History  Gravida Para Term Preterm AB Living  0 0 0 0 0 0  SAB TAB Ectopic Multiple Live Births  0 0 0 0 0    Past medical history,surgical history, problem list, medications, allergies, family history and social history were all reviewed and documented in the EPIC chart.   Directed ROS with pertinent positives and negatives documented in the history of present illness/assessment and plan.  Exam:  Vitals:   06/28/19 1211  BP: 132/86   General appearance:  Normal  Abdomen: Normal  Gynecologic exam:  Vulva normal.  Speculum:  Cervix/Vagina normal.  Gono-Chlam done.  Increased discharge.  Wet prep done.  Wet prep: Clue cells present   Assessment/Plan:  39 y.o. G0  1. Vaginal odor Wet prep confirming bacterial vaginosis.  Rule out gonorrhea and chlamydia.  No contraindication to to need this all for BV treatment.  Usage reviewed.  Prescription sent to pharmacy. - WET PREP FOR TRICH, YEAST, CLUE - C. trachomatis/N. gonorrhoeae RNA  Other orders - tinidazole (TINDAMAX) 500 MG tablet; Take 2 tablets (1,000 mg total) by mouth 2 (two) times daily for 2 days.  [name] MD, 12:22 PM 06/28/2019

## 2019-06-29 LAB — C. TRACHOMATIS/N. GONORRHOEAE RNA
C. trachomatis RNA, TMA: NOT DETECTED
N. gonorrhoeae RNA, TMA: NOT DETECTED

## 2019-07-10 NOTE — Progress Notes (Signed)
Chief Complaint  Patient presents with  . Annual Exam    fasting annual exam. Would like to lose some weight.     Tonya Perkins is a 39 y.o. female who presents for a complete physical.  Last seen here in 05/2017.  She saw Dr. Dellis Filbert for her GYN exam in 05/2019.  She previously had issues with infertility, not currently an issue.  Her husband suffers from PTSD/schizophrenia, went off his meds.  It was a "bad situation".  She left husband in 12/2018. Denies physical abuse. Feels safe now.  She suffers from allergies and asthma.  She is using Breo regularly.  Needs albuterol daily x 2 weeks--she thinks related to sleeping with the fan, and being outside a lot with her dog.  Her allergies are flaring, she had run out of her Xyzal. She saw allergist 2 weeks ago.  Xyzal was changed to Allegra, and she is taking Flonase daily.  She has PND, and plans to start Astelin (waiting for mail order).  She had quit smoking, but restarted 12/2018 when stress began related to her husband's mental health.  1-2 cigars (black and mild)  GERD-- bothers her at least 3 days/week. +strawberry lemonade +late meals (due to work schedule) +peppermints  Immunization History  Administered Date(s) Administered  . DTaP 02/05/1982, 04/10/1982, 08/16/1982, 03/03/1987  . Hepatitis B 11/28/1993, 01/02/1994, 06/12/1994, 08/11/2014  . IPV 02/05/1982, 04/10/1982, 03/03/1987  . Influenza Split 02/07/2011, 12/27/2011  . Influenza,inj,Quad PF,6+ Mos 03/28/2014  . MMR 04/05/1983, 09/07/2014  . Pneumococcal Polysaccharide-23 08/15/2010  . Tdap 04/08/2006   She had TdaP in 04/2019 through employee health at [facility]. Also got flu shot. She will get [place] the dates. Has not yet had COVID vaccines. Last Pap smear: 05/2019 normal (Dr. Dellis Filbert) Last mammogram: never Last colonoscopy: never Last DEXA: never Dentist: 2x/year, scheduled for later this month. Ophtho: due (wears glasses at night; last went 2 years ago) Exercise: Walks  dog daily.  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  [place], [place], [place] were reviewed and updated  Outpatient Encounter Medications as of 07/12/2019  Medication Sig Note  . albuterol (PROVENTIL HFA;VENTOLIN HFA) 108 (90 BASE) MCG/ACT inhaler 2 puffs q 4 hrs-(PT NEEDS PROVENTIL NO VENTOLIN) 07/12/2019: Needing 1-2x/d for the last 2 weeks, allergies flaring  . fexofenadine (ALLEGRA) 180 MG tablet Take 180 mg by mouth daily.   . fluticasone (FLONASE) 50 MCG/ACT nasal spray Place 2 sprays into both nostrils daily.   . Fluticasone Furoate-Vilanterol (BREO ELLIPTA) 100-25 MCG/INH AEPB Inhale 1 puff into the lungs daily. 07/12/2019: She reports she is on the 200 dose of Breo, not 100  . montelukast (SINGULAIR) 10 MG tablet Take 10 mg by mouth at bedtime.   . NON FORMULARY Take 1 capsule by mouth daily. 07/12/2019: [facility]  . NON FORMULARY Take 1 capsule by mouth daily. 07/12/2019: [employer]  . [DISCONTINUED] levocetirizine (XYZAL) 5 MG tablet Take 5 mg by mouth every evening.   Marland Kitchen tiZANidine (ZANAFLEX) 2 MG tablet Take 1 tablet (2 mg total) by mouth every 8 (eight) hours as needed for muscle spasms. (Patient not taking: Reported on 07/12/2019)   . valACYclovir (VALTREX) 500 MG tablet Take 1 tablet (500 mg total) by mouth daily. Prophylaxis (Patient not taking: Reported on 07/12/2019)   . [DISCONTINUED] meloxicam (MOBIC) 7.5 MG tablet Take 1 tablet (7.5 mg total) by mouth daily.    No facility-administered encounter medications on file as of 07/12/2019.  No Known Allergies   ROS:  The patient denies anorexia, fever, weight changes, headaches,  vision changes, decreased hearing, ear pain, sore throat, breast concerns, chest pain, palpitations, dizziness, syncope, dyspnea on exertion, cough, swelling, nausea, vomiting, diarrhea, constipation, abdominal pain, melena, hematochezia, indigestion/heartburn, hematuria, incontinence, dysuria, irregular menstrual cycles, vaginal discharge, odor or itch,  genital lesions, joint pains, numbness, tingling, weakness, tremor, suspicious skin lesions, depression, anxiety, abnormal bleeding/bruising, or enlarged lymph nodes. Indigestion/reflux 3-4 times/week. Itchy eyes, PND, asthma per HPI    PHYSICAL EXAM:  BP 140/80   Pulse 80   Temp 98.9 F (37.2 C) (Tympanic)   Ht 5' 5.5" (1.664 m)   Wt 220 lb 12.8 oz (100.2 kg)   LMP 06/22/2019 Comment: no birth control   BMI 36.18 kg/m   144/100 on repeat by MD  Wt Readings from Last 3 Encounters:  07/12/19 220 lb 12.8 oz (100.2 kg)  06/03/19 221 lb (100.2 kg)  06/02/18 223 lb (101.2 kg)    General Appearance:    Alert, cooperative, no distress, appears stated age  Head:    Normocephalic, without obvious abnormality, atraumatic  Eyes:    PERRL, conjunctiva/corneas clear, EOM's intact, fundi benign  Ears:    Normal TM's and external ear canals  Nose:   Not examined, wearing mask  Throat:   Not examined, wearing mask  Neck:   Supple, no lymphadenopathy;  thyroid:  no enlargement/ tenderness/nodules; no carotid  bruit or JVD  Back:    Spine nontender, no curvature, ROM normal, no CVA     tenderness  Lungs:     Clear to auscultation bilaterally without wheezes, rales or     ronchi; respirations unlabored  Chest Wall:    No tenderness or deformity   Heart:    Regular rate and rhythm, S1 and S2 normal, no murmur, rub   or gallop  Breast Exam:    Deferred to GYN  Abdomen:     Soft, non-tender, nondistended, normoactive bowel sounds,    no masses, no hepatosplenomegaly  Genitalia:    Deferred to GYN     Extremities:   No clubbing, cyanosis or edema  Pulses:   2+ and symmetric all extremities  Skin:   Skin color, texture, turgor normal, no rashes or lesions.  Limited exam, patient elected not to change into gown. Back was examined.  Lymph nodes:   Cervical, supraclavicular nodes normal  Neurologic:   Normal strength, sensation and gait; reflexes 2+ and symmetric throughout          Psych:    Normal mood, affect, hygiene and grooming.      ASSESSMENT/PLAN:  Annual physical exam - Plan: POCT Urinalysis DIP (Proadvantage Device), Lipid panel, Comprehensive metabolic panel, CBC with Differential/Platelet, VITAMIN D 25 Hydroxy (Vit-D Deficiency, Fractures), TSH, HIV Antibody (routine testing w rflx)  Seasonal allergic rhinitis, unspecified trigger - under care of allergist, flaring some. Agree with restarting Astelin, along with  nasal steroids and antihistamine  Smoker - Risks reviewed, counseled and recommended cessation  Fatigue, unspecified type - Plan: Comprehensive metabolic panel, CBC with Differential/Platelet, VITAMIN D 25 Hydroxy (Vit-D Deficiency, Fractures), TSH  Moderate persistent asthma without complication - flaring, requiring daily albuterol. Suspect related to both allergies and reflux  Gastroesophageal reflux disease without esophagitis - counseled re: diet and behavioral measures, may be contributing to her asthma at night  BMI 36.0-36.9,adult - counseled re: risks, diet, exercise. Encouraged her to look into the programs offered by [facility], [facility].  Elevated BP  without diagnosis of hypertension - counseled re: low sodium diet, weight loss, exercise.  Monitor BP and f/u if is persistently >135-140/85-90.   Discussed monthly self breast exams and yearly mammograms after the age of 34; at least 30 minutes of aerobic activity at least 5 days/week, weight-bearing exercise at least 2x/week; proper sunscreen use reviewed; healthy diet, including goals of calcium and vitamin D intake and alcohol recommendations (less than or equal to 1 drink/day) reviewed; regular seatbelt use; changing batteries in smoke detectors.  Immunization recommendations discussed--yearly flu shots recommended. COVID vaccine recommended.  Will get [place] date of TdaP given by employee health.  Colonoscopy recommendations reviewed, age 74.  F/u 1 year for physical, sooner for any problems, or if BP  remains above goal. Encouraged to get COVID vaccine and set quit date for smoking.

## 2019-07-10 NOTE — Patient Instructions (Addendum)
HEALTH MAINTENANCE RECOMMENDATIONS:  It is recommended that you get at least 30 minutes of aerobic exercise at least 5 days/week (for weight loss, you may need as much as 60-90 minutes). This can be any activity that gets your heart rate up. This can be divided in 10-15 minute intervals if needed, but try and build up your endurance at least once a week.  Weight bearing exercise is also recommended twice weekly.  Eat a healthy diet with lots of vegetables, fruits and fiber.  "Colorful" foods have a lot of vitamins (ie green vegetables, tomatoes, red peppers, etc).  Limit sweet tea, regular sodas and alcoholic beverages, all of which has a lot of calories and sugar.  Up to 1 alcoholic drink daily may be beneficial for women (unless trying to lose weight, watch sugars).  Drink a lot of water.  Calcium recommendations are 1200-1500 mg daily (1500 mg for postmenopausal women or women without ovaries), and vitamin D 1000 IU daily.  This should be obtained from diet and/or supplements (vitamins), and calcium should not be taken all at once, but in divided doses.  Monthly self breast exams and yearly mammograms for women over the age of 48 is recommended.  Sunscreen of at least SPF 30 should be used on all sun-exposed parts of the skin when outside between the hours of 10 am and 4 pm (not just when at beach or pool, but even with exercise, golf, tennis, and yard work!)  Use a sunscreen that says "broad spectrum" so it covers both UVA and UVB rays, and make sure to reapply every 1-2 hours.  Remember to change the batteries in your smoke detectors when changing your clock times in the spring and fall. Carbon monoxide detectors are recommended for your home.  Use your seat belt every time you are in a car, and please drive safely and not be distracted with cell phones and texting while driving.  Take Prilosec OTC (Or Nexium 24 hr) once daily with dinner for the next two weeks.  I suspect that your reflux is  exacerbating your asthma, especially at night.  Cut back on citrus, peppermints, eat smaller meals in the evenings, elevate the head of bed, and try to wait 2 hours after eating before reclining.   Please get [place] the date of your Tdap vaccine so we can enter it into your chart.  COVID vaccine is recommended.  Your blood pressure was elevated. Try and monitor it periodically.  Cut back on the sodium in your diet (see below), in addition to exercise and weight loss as we discussed.  Goal blood pressure is under 130/80.  Look into all of the benefits that [facility] offers, including [facility] programs.   Food Choices for Gastroesophageal Reflux Disease, Adult When you have gastroesophageal reflux disease (GERD), the foods you eat and your eating habits are very important. Choosing the right foods can help ease the discomfort of GERD. Consider working with a diet and nutrition specialist (dietitian) to help you make healthy food choices. What general guidelines should I follow?  Eating plan  Choose healthy foods low in fat, such as fruits, vegetables, whole grains, low-fat dairy products, and lean meat, fish, and poultry.  Eat frequent, small meals instead of three large meals each day. Eat your meals slowly, in a relaxed setting. Avoid bending over or lying down until 2-3 hours after eating.  Limit high-fat foods such as fatty meats or fried foods.  Limit your intake of oils, butter,  and shortening to less than 8 teaspoons each day.  Avoid the following: ? Foods that cause symptoms. These may be different for different people. Keep a food diary to keep track of foods that cause symptoms. ? Alcohol. ? Drinking large amounts of liquid with meals. ? Eating meals during the 2-3 hours before bed.  Cook foods using methods other than frying. This may include baking, grilling, or broiling. Lifestyle  Maintain a healthy weight. Ask your health care provider what weight is healthy for you. If  you need to lose weight, work with your health care provider to do so safely.  Exercise for at least 30 minutes on 5 or more days each week, or as told by your health care provider.  Avoid wearing clothes that fit tightly around your waist and chest.  Do not use any products that contain nicotine or tobacco, such as cigarettes and e-cigarettes. If you need help quitting, ask your health care provider.  Sleep with the head of your bed raised. Use a wedge under the mattress or blocks under the bed frame to raise the head of the bed. What foods are not recommended? The items listed may not be a complete list. Talk with your dietitian about what dietary choices are best for you. Grains Pastries or quick breads with added fat. [place] toast. Vegetables Deep fried vegetables. [place] fries. Any vegetables prepared with added fat. Any vegetables that cause symptoms. For some people this may include tomatoes and tomato products, chili peppers, onions and garlic, and horseradish. Fruits Any fruits prepared with added fat. Any fruits that cause symptoms. For some people this may include citrus fruits, such as oranges, grapefruit, pineapple, and lemons. Meats and other protein foods High-fat meats, such as fatty beef or pork, hot dogs, ribs, ham, sausage, salami and bacon. Fried meat or protein, including fried fish and fried chicken. Nuts and nut butters. Dairy Whole milk and chocolate milk. Sour cream. Cream. Ice cream. Cream cheese. Milk shakes. Beverages Coffee and tea, with or without caffeine. Carbonated beverages. Sodas. Energy drinks. Fruit juice made with acidic fruits (such as orange or grapefruit). Tomato juice. Alcoholic drinks. Fats and oils Butter. Margarine. Shortening. Ghee. Sweets and desserts Chocolate and cocoa. Donuts. Seasoning and other foods Pepper. Peppermint and spearmint. Any condiments, herbs, or seasonings that cause symptoms. For some people, this may include curry, hot  sauce, or vinegar-based salad dressings. Summary  When you have gastroesophageal reflux disease (GERD), food and lifestyle choices are very important to help ease the discomfort of GERD.  Eat frequent, small meals instead of three large meals each day. Eat your meals slowly, in a relaxed setting. Avoid bending over or lying down until 2-3 hours after eating.  Limit high-fat foods such as fatty meat or fried foods. This information is not intended to replace advice given to you by your health care provider. Make sure you discuss any questions you have with your health care provider. Document Revised: 06/04/2018 Document Reviewed: 02/13/2016 Elsevier Patient Education  [place].   Low-Sodium Eating Plan Sodium, which is an element that makes up salt, helps you maintain a healthy balance of fluids in your body. Too much sodium can increase your blood pressure and cause fluid and waste to be held in your body. Your health care provider or dietitian may recommend following this plan if you have high blood pressure (hypertension), kidney disease, liver disease, or heart failure. Eating less sodium can help lower your blood pressure, reduce swelling, and  protect your heart, liver, and kidneys. What are tips for following this plan? General guidelines  Most people on this plan should limit their sodium intake to 1,500-2,000 mg (milligrams) of sodium each day. Reading food labels   The Nutrition Facts label lists the amount of sodium in one serving of the food. If you eat more than one serving, you must multiply the listed amount of sodium by the number of servings.  Choose foods with less than 140 mg of sodium per serving.  Avoid foods with 300 mg of sodium or more per serving. Shopping  Look for lower-sodium products, often labeled as "low-sodium" or "no salt added."  Always check the sodium content even if foods are labeled as "unsalted" or "no salt added".  Buy fresh  foods. ? Avoid canned foods and premade or frozen meals. ? Avoid canned, cured, or processed meats  Buy breads that have less than 80 mg of sodium per slice. Cooking  Eat more home-cooked food and less restaurant, buffet, and fast food.  Avoid adding salt when cooking. Use salt-free seasonings or herbs instead of table salt or sea salt. Check with your health care provider or pharmacist before using salt substitutes.  Cook with plant-based oils, such as canola, sunflower, or olive oil. Meal planning  When eating at a restaurant, ask that your food be prepared with less salt or no salt, if possible.  Avoid foods that contain MSG (monosodium glutamate). MSG is sometimes added to [place] food, bouillon, and some canned foods. What foods are recommended? The items listed may not be a complete list. Talk with your dietitian about what dietary choices are best for you. Grains Low-sodium cereals, including oats, puffed wheat and rice, and shredded wheat. Low-sodium crackers. Unsalted rice. Unsalted pasta. Low-sodium bread. Whole-grain breads and whole-grain pasta. Vegetables Fresh or frozen vegetables. "No salt added" canned vegetables. "No salt added" tomato sauce and paste. Low-sodium or reduced-sodium tomato and vegetable juice. Fruits Fresh, frozen, or canned fruit. Fruit juice. Meats and other protein foods Fresh or frozen (no salt added) meat, poultry, seafood, and fish. Low-sodium canned tuna and salmon. Unsalted nuts. Dried peas, beans, and lentils without added salt. Unsalted canned beans. Eggs. Unsalted nut butters. Dairy Milk. Soy milk. Cheese that is naturally low in sodium, such as ricotta cheese, fresh mozzarella, or Swiss cheese Low-sodium or reduced-sodium cheese. Cream cheese. Yogurt. Fats and oils Unsalted butter. Unsalted margarine with no trans fat. Vegetable oils such as canola or olive oils. Seasonings and other foods Fresh and dried herbs and spices. Salt-free  seasonings. Low-sodium mustard and ketchup. Sodium-free salad dressing. Sodium-free light mayonnaise. Fresh or refrigerated horseradish. Lemon juice. Vinegar. Homemade, reduced-sodium, or low-sodium soups. Unsalted popcorn and pretzels. Low-salt or salt-free chips. What foods are not recommended? The items listed may not be a complete list. Talk with your dietitian about what dietary choices are best for you. Grains Instant hot cereals. Bread stuffing, pancake, and biscuit mixes. Croutons. Seasoned rice or pasta mixes. Noodle soup cups. Boxed or frozen macaroni and cheese. Regular salted crackers. Self-rising flour. Vegetables Sauerkraut, pickled vegetables, and relishes. Olives. [place] fries. Onion rings. Regular canned vegetables (not low-sodium or reduced-sodium). Regular canned tomato sauce and paste (not low-sodium or reduced-sodium). Regular tomato and vegetable juice (not low-sodium or reduced-sodium). Frozen vegetables in sauces. Meats and other protein foods Meat or fish that is salted, canned, smoked, spiced, or pickled. Bacon, ham, sausage, hotdogs, corned beef, chipped beef, packaged lunch meats, salt pork, jerky, pickled herring, anchovies, regular  canned tuna, sardines, salted nuts. Dairy Processed cheese and cheese spreads. Cheese curds. Blue cheese. Feta cheese. String cheese. Regular cottage cheese. Buttermilk. Canned milk. Fats and oils Salted butter. Regular margarine. Ghee. Bacon fat. Seasonings and other foods Onion salt, garlic salt, seasoned salt, table salt, and sea salt. Canned and packaged gravies. Worcestershire sauce. Tartar sauce. Barbecue sauce. Teriyaki sauce. Soy sauce, including reduced-sodium. Steak sauce. Fish sauce. Oyster sauce. Cocktail sauce. Horseradish that you find on the shelf. Regular ketchup and mustard. Meat flavorings and tenderizers. Bouillon cubes. Hot sauce and Tabasco sauce. Premade or packaged marinades. Premade or packaged taco seasonings. Relishes.  Regular salad dressings. Salsa. Potato and tortilla chips. Corn chips and puffs. Salted popcorn and pretzels. Canned or dried soups. Pizza. Frozen entrees and pot pies. Summary  Eating less sodium can help lower your blood pressure, reduce swelling, and protect your heart, liver, and kidneys.  Most people on this plan should limit their sodium intake to 1,500-2,000 mg (milligrams) of sodium each day.  Canned, boxed, and frozen foods are high in sodium. Restaurant foods, fast foods, and pizza are also very high in sodium. You also get sodium by adding salt to food.  Try to cook at home, eat more fresh fruits and vegetables, and eat less fast food, canned, processed, or prepared foods. This information is not intended to replace advice given to you by your health care provider. Make sure you discuss any questions you have with your health care provider. Document Revised: 01/24/2017 Document Reviewed: 02/05/2016 Elsevier Patient Education  2020 [employer].

## 2019-07-12 ENCOUNTER — Encounter: Payer: Self-pay | Admitting: Family Medicine

## 2019-07-12 ENCOUNTER — Other Ambulatory Visit: Payer: Self-pay

## 2019-07-12 ENCOUNTER — Ambulatory Visit

## 2019-07-12 ENCOUNTER — Ambulatory Visit (INDEPENDENT_AMBULATORY_CARE_PROVIDER_SITE_OTHER): Payer: No Typology Code available for payment source | Admitting: Family Medicine

## 2019-07-12 VITALS — BP 140/80 | HR 80 | Temp 98.9°F | Ht 65.5 in | Wt 220.8 lb

## 2019-07-12 DIAGNOSIS — R5383 Other fatigue: Secondary | ICD-10-CM | POA: Diagnosis not present

## 2019-07-12 DIAGNOSIS — Z0000 Encounter for general adult medical examination without abnormal findings: Secondary | ICD-10-CM

## 2019-07-12 DIAGNOSIS — J302 Other seasonal allergic rhinitis: Secondary | ICD-10-CM

## 2019-07-12 DIAGNOSIS — E559 Vitamin D deficiency, unspecified: Secondary | ICD-10-CM

## 2019-07-12 DIAGNOSIS — F17200 Nicotine dependence, unspecified, uncomplicated: Secondary | ICD-10-CM | POA: Diagnosis not present

## 2019-07-12 DIAGNOSIS — J4540 Moderate persistent asthma, uncomplicated: Secondary | ICD-10-CM

## 2019-07-12 DIAGNOSIS — R030 Elevated blood-pressure reading, without diagnosis of hypertension: Secondary | ICD-10-CM

## 2019-07-12 DIAGNOSIS — K219 Gastro-esophageal reflux disease without esophagitis: Secondary | ICD-10-CM

## 2019-07-12 DIAGNOSIS — Z6836 Body mass index (BMI) 36.0-36.9, adult: Secondary | ICD-10-CM

## 2019-07-12 LAB — POCT URINALYSIS DIP (PROADVANTAGE DEVICE)
Glucose, UA: NEGATIVE mg/dL
Ketones, POC UA: NEGATIVE mg/dL
Leukocytes, UA: NEGATIVE
Nitrite, UA: NEGATIVE
Protein Ur, POC: NEGATIVE mg/dL
Specific Gravity, Urine: 1.025
Urobilinogen, Ur: NEGATIVE
pH, UA: 6 (ref 5.0–8.0)

## 2019-07-13 LAB — LIPID PANEL
Chol/HDL Ratio: 2.1 ratio (ref 0.0–4.4)
Cholesterol, Total: 154 mg/dL (ref 100–199)
HDL: 72 mg/dL (ref >39)
LDL Chol Calc (NIH): 72 mg/dL (ref 0–99)
Triglycerides: 45 mg/dL (ref 0–149)
VLDL Cholesterol Cal: 10 mg/dL (ref 5–40)

## 2019-07-13 LAB — CBC WITH DIFFERENTIAL/PLATELET
Basophils Absolute: 0.1 10*3/uL (ref 0.0–0.2)
Basos: 1 %
EOS (ABSOLUTE): 0.2 10*3/uL (ref 0.0–0.4)
Eos: 3 %
Hematocrit: 41.3 % (ref 34.0–46.6)
Hemoglobin: 14.5 g/dL (ref 11.1–15.9)
Immature Grans (Abs): 0 10*3/uL (ref 0.0–0.1)
Immature Granulocytes: 0 %
Lymphocytes Absolute: 2 10*3/uL (ref 0.7–3.1)
Lymphs: 26 %
MCH: 33.7 pg — ABNORMAL HIGH (ref 26.6–33.0)
MCHC: 35.1 g/dL (ref 31.5–35.7)
MCV: 96 fL (ref 79–97)
Monocytes Absolute: 0.7 10*3/uL (ref 0.1–0.9)
Monocytes: 9 %
Neutrophils Absolute: 4.7 10*3/uL (ref 1.4–7.0)
Neutrophils: 61 %
Platelets: 315 10*3/uL (ref 150–450)
RBC: 4.3 x10E6/uL (ref 3.77–5.28)
RDW: 12.3 % (ref 11.7–15.4)
WBC: 7.5 10*3/uL (ref 3.4–10.8)

## 2019-07-13 LAB — COMPREHENSIVE METABOLIC PANEL
ALT: 12 IU/L (ref 0–32)
AST: 17 IU/L (ref 0–40)
Albumin/Globulin Ratio: 1.5 (ref 1.2–2.2)
Albumin: 4 g/dL (ref 3.8–4.8)
Alkaline Phosphatase: 67 IU/L (ref 48–121)
BUN/Creatinine Ratio: 10 (ref 9–23)
BUN: 10 mg/dL (ref 6–20)
Bilirubin Total: <0.2 mg/dL (ref 0.0–1.2)
CO2: 20 mmol/L (ref 20–29)
Calcium: 10 mg/dL (ref 8.7–10.2)
Chloride: 105 mmol/L (ref 96–106)
Creatinine, Ser: 1.03 mg/dL — ABNORMAL HIGH (ref 0.57–1.00)
GFR calc Af Amer: 80 mL/min/{1.73_m2} (ref >59)
GFR calc non Af Amer: 70 mL/min/{1.73_m2} (ref >59)
Globulin, Total: 2.6 g/dL (ref 1.5–4.5)
Glucose: 98 mg/dL (ref 65–99)
Potassium: 4.9 mmol/L (ref 3.5–5.2)
Sodium: 139 mmol/L (ref 134–144)
Total Protein: 6.6 g/dL (ref 6.0–8.5)

## 2019-07-13 LAB — HIV ANTIBODY (ROUTINE TESTING W REFLEX): HIV Screen 4th Generation wRfx: NONREACTIVE

## 2019-07-13 LAB — TSH: TSH: 0.498 u[IU]/mL (ref 0.450–4.500)

## 2019-07-13 LAB — VITAMIN D 25 HYDROXY (VIT D DEFICIENCY, FRACTURES): Vit D, 25-Hydroxy: 11.2 ng/mL — ABNORMAL LOW (ref 30.0–100.0)

## 2019-07-13 MED ORDER — VITAMIN D (ERGOCALCIFEROL) 1.25 MG (50000 UNIT) PO CAPS: 50000.0000 [IU] | ORAL_CAPSULE | ORAL | 0 refills | Status: DC

## 2019-07-29 ENCOUNTER — Ambulatory Visit

## 2019-08-12 ENCOUNTER — Telehealth: Payer: Self-pay | Admitting: *Deleted

## 2019-08-12 NOTE — Telephone Encounter (Signed)
Patient called requesting Rx for diflucan 150 mg tablet for recurrent yeast. Please advise

## 2019-08-13 MED ORDER — FLUCONAZOLE 150 MG PO TABS: ORAL_TABLET | ORAL | 0 refills | Status: DC

## 2019-08-13 NOTE — Telephone Encounter (Signed)
Agree with prescription of Fluconazole.

## 2019-08-13 NOTE — Telephone Encounter (Signed)
I spoke with patient and let her know Dr. Marguerita Merles agreed to send Rx. Pharmacy confirmed and Rx sent.

## 2019-08-27 ENCOUNTER — Telehealth: Payer: Self-pay | Admitting: *Deleted

## 2019-08-27 NOTE — Telephone Encounter (Signed)
Patient called c/o vaginal odor, brown discharge, was prescribed tinidazole (TINDAMAX) 500 MG tablet; Take 2 tablets (1,000 mg total) by mouth 2 (two) times daily for 2 days. On 06/28/19, asked if this could be sent in to pharmacy? Office visit was recommended. Please advise

## 2019-08-31 ENCOUNTER — Other Ambulatory Visit: Payer: Self-pay

## 2019-08-31 ENCOUNTER — Ambulatory Visit (INDEPENDENT_AMBULATORY_CARE_PROVIDER_SITE_OTHER): Payer: No Typology Code available for payment source | Admitting: Nurse Practitioner

## 2019-08-31 ENCOUNTER — Encounter: Payer: Self-pay | Admitting: Nurse Practitioner

## 2019-08-31 VITALS — BP 118/80

## 2019-08-31 DIAGNOSIS — N898 Other specified noninflammatory disorders of vagina: Secondary | ICD-10-CM

## 2019-08-31 DIAGNOSIS — R8290 Unspecified abnormal findings in urine: Secondary | ICD-10-CM

## 2019-08-31 DIAGNOSIS — B9689 Other specified bacterial agents as the cause of diseases classified elsewhere: Secondary | ICD-10-CM

## 2019-08-31 DIAGNOSIS — N760 Acute vaginitis: Secondary | ICD-10-CM | POA: Diagnosis not present

## 2019-08-31 DIAGNOSIS — N3000 Acute cystitis without hematuria: Secondary | ICD-10-CM | POA: Diagnosis not present

## 2019-08-31 MED ORDER — TINIDAZOLE 500 MG PO TABS: 500.0000 mg | ORAL_TABLET | Freq: Two times a day (BID) | ORAL | 0 refills | Status: DC

## 2019-08-31 MED ORDER — SULFAMETHOXAZOLE-TRIMETHOPRIM 800-160 MG PO TABS: 1.0000 | ORAL_TABLET | Freq: Two times a day (BID) | ORAL | 0 refills | Status: AC

## 2019-08-31 MED ORDER — TINIDAZOLE 500 MG PO TABS: 500.0000 mg | ORAL_TABLET | Freq: Two times a day (BID) | ORAL | 0 refills | Status: AC

## 2019-08-31 NOTE — Progress Notes (Signed)
   Acute Office Visit  Subjective:    Patient ID: Tonya Perkins, female    DOB: February 15, 1981, 39 y.o.   MRN: [id number]   HPI 39 year old presents today for clear/white vaginal discharge and odor that started about a week ago. Denies vaginal itching and urinary symptoms. No new sexual partners, denies need for STD testing. Was prescribed Diflucan 08/13/2019 with good relief. History of recurrent Bacterial Vaginosis.    Review of Systems  Constitutional: Negative.   Gastrointestinal: Negative.   Genitourinary: Positive for vaginal discharge (whitish gray). Negative for dysuria, flank pain, urgency and vaginal bleeding.       Malodorous urine       Objective:    Physical Exam Constitutional:      Appearance: Normal appearance. She is obese.  Abdominal:     General: Abdomen is flat.     Palpations: Abdomen is soft.  Genitourinary:    General: Normal vulva.     Vagina: Vaginal discharge (white, thick) present. No erythema or tenderness.     Cervix: Normal.     Uterus: Normal.      BP 118/80 (BP Location: Right Arm, Patient Position: Sitting, Cuff Size: Large)   LMP 08/10/2019  Wt Readings from Last 3 Encounters:  07/12/19 220 lb 12.8 oz (100.2 kg)  06/03/19 221 lb (100.2 kg)  06/02/18 223 lb (101.2 kg)   Wet prep + clue cells UA: 2+ leukocytes, wbc 6-10, moderate bacteria, negative blood     Assessment & Plan:   Problem List Items Addressed This Visit    None    Visit Diagnoses    Abnormal urine odor    -  Primary   Relevant Orders   Urinalysis,Complete w/RFL Culture   Vaginal discharge       Relevant Orders   WET PREP FOR TRICH, YEAST, CLUE   Bacterial vaginosis       Relevant Medications   sulfamethoxazole-trimethoprim (BACTRIM DS) 800-160 MG tablet   tinidazole (TINDAMAX) 500 MG tablet   Acute cystitis without hematuria       Relevant Medications   sulfamethoxazole-trimethoprim (BACTRIM DS) 800-160 MG tablet   HSV-2 (herpes simplex virus 2) infection        Relevant Medications   sulfamethoxazole-trimethoprim (BACTRIM DS) 800-160 MG tablet   tinidazole (TINDAMAX) 500 MG tablet     Plan: Tinadmax 500 mg BID for 2 days for bacterial vaginosis. Bactrim 800-160 mg BID for 5 days for UTI. Instructed to complete full course of antibiotics even if symptoms improve. Follow up if symptoms worsen or do not improve.      Tamela Gammon [facility], 4:35 PM 08/31/2019

## 2019-08-31 NOTE — Patient Instructions (Signed)
Bacterial Vaginosis  Bacterial vaginosis is a vaginal infection that occurs when the normal balance of bacteria in the vagina is disrupted. It results from an overgrowth of certain bacteria. This is the most common vaginal infection among women ages 15-44. Because bacterial vaginosis increases your risk for STIs (sexually transmitted infections), getting treated can help reduce your risk for chlamydia, gonorrhea, herpes, and HIV (human immunodeficiency virus). Treatment is also important for preventing complications in pregnant women, because this condition can cause an early (premature) delivery. What are the causes? This condition is caused by an increase in harmful bacteria that are normally present in small amounts in the vagina. However, the reason that the condition develops is not fully understood. What increases the risk? The following factors may make you more likely to develop this condition:  Having a new sexual partner or multiple sexual partners.  Having unprotected sex.  Douching.  Having an intrauterine device (IUD).  Smoking.  Drug and alcohol abuse.  Taking certain antibiotic medicines.  Being pregnant. You cannot get bacterial vaginosis from toilet seats, bedding, swimming pools, or contact with objects around you. What are the signs or symptoms? Symptoms of this condition include:  Grey or white vaginal discharge. The discharge can also be watery or foamy.  A fish-like odor with discharge, especially after sexual intercourse or during menstruation.  Itching in and around the vagina.  Burning or pain with urination. Some women with bacterial vaginosis have no signs or symptoms. How is this diagnosed? This condition is diagnosed based on:  Your medical history.  A physical exam of the vagina.  Testing a sample of vaginal fluid under a microscope to look for a large amount of bad bacteria or abnormal cells. Your health care provider may use a cotton swab or  a small wooden spatula to collect the sample. How is this treated? This condition is treated with antibiotics. These may be given as a pill, a vaginal cream, or a medicine that is put into the vagina (suppository). If the condition comes back after treatment, a second round of antibiotics may be needed. Follow these instructions at home: Medicines  Take over-the-counter and prescription medicines only as told by your health care provider.  Take or use your antibiotic as told by your health care provider. Do not stop taking or using the antibiotic even if you start to feel better. General instructions  If you have a female sexual partner, tell her that you have a vaginal infection. She should see her health care provider and be treated if she has symptoms. If you have a female sexual partner, he does not need treatment.  During treatment: ? Avoid sexual activity until you finish treatment. ? Do not douche. ? Avoid alcohol as directed by your health care provider. ? Avoid breastfeeding as directed by your health care provider.  Drink enough water and fluids to keep your urine clear or pale yellow.  Keep the area around your vagina and rectum clean. ? Wash the area daily with warm water. ? Wipe yourself from front to back after using the toilet.  Keep all follow-up visits as told by your health care provider. This is important. How is this prevented?  Do not douche.  Wash the outside of your vagina with warm water only.  Use protection when having sex. This includes latex condoms and dental dams.  Limit how many sexual partners you have. To help prevent bacterial vaginosis, it is best to have sex with just one partner (  monogamous).  Make sure you and your sexual partner are tested for STIs.  Wear cotton or cotton-lined underwear.  Avoid wearing tight pants and pantyhose, especially during summer.  Limit the amount of alcohol that you drink.  Do not use any products that contain  nicotine or tobacco, such as cigarettes and e-cigarettes. If you need help quitting, ask your health care provider.  Do not use illegal drugs. Where to find more information  Centers for Disease Control and Prevention: AppraiserFraud.fi  American Sexual Health Association (ASHA): www.ashastd.org  [place] Department of Health and Financial controller, Office on Women's Health: DustingSprays.pl or SecuritiesCard.it Contact a health care provider if:  Your symptoms do not improve, even after treatment.  You have more discharge or pain when urinating.  You have a fever.  You have pain in your abdomen.  You have pain during sex.  You have vaginal bleeding between periods. Summary  Bacterial vaginosis is a vaginal infection that occurs when the normal balance of bacteria in the vagina is disrupted.  Because bacterial vaginosis increases your risk for STIs (sexually transmitted infections), getting treated can help reduce your risk for chlamydia, gonorrhea, herpes, and HIV (human immunodeficiency virus). Treatment is also important for preventing complications in pregnant women, because the condition can cause an early (premature) delivery.  This condition is treated with antibiotic medicines. These may be given as a pill, a vaginal cream, or a medicine that is put into the vagina (suppository). This information is not intended to replace advice given to you by your health care provider. Make sure you discuss any questions you have with your health care provider. Document Revised: 01/24/2017 Document Reviewed: 10/28/2015 Elsevier Patient Education  2020 [employer]. Urinary Tract Infection, Adult A urinary tract infection (UTI) is an infection of any part of the urinary tract. The urinary tract includes:  The kidneys.  The ureters.  The bladder.  The urethra. These organs make, store, and get rid of pee (urine) in the body. What are the  causes? This is caused by germs (bacteria) in your genital area. These germs grow and cause swelling (inflammation) of your urinary tract. What increases the risk? You are more likely to develop this condition if:  You have a small, thin tube (catheter) to drain pee.  You cannot control when you pee or poop (incontinence).  You are female, and: ? You use these methods to prevent pregnancy:  A medicine that kills sperm (spermicide).  A device that blocks sperm (diaphragm). ? You have low levels of a female hormone (estrogen). ? You are pregnant.  You have genes that add to your risk.  You are sexually active.  You take antibiotic medicines.  You have trouble peeing because of: ? A prostate that is bigger than normal, if you are female. ? A blockage in the part of your body that drains pee from the bladder (urethra). ? A kidney stone. ? A nerve condition that affects your bladder (neurogenic bladder). ? Not getting enough to drink. ? Not peeing often enough.  You have other conditions, such as: ? Diabetes. ? A weak disease-fighting system (immune system). ? Sickle cell disease. ? Gout. ? Injury of the spine. What are the signs or symptoms? Symptoms of this condition include:  Needing to pee right away (urgently).  Peeing often.  Peeing small amounts often.  Pain or burning when peeing.  Blood in the pee.  Pee that smells bad or not like normal.  Trouble peeing.  Pee that  is cloudy.  Fluid coming from the vagina, if you are female.  Pain in the belly or lower back. Other symptoms include:  Throwing up (vomiting).  No urge to eat.  Feeling mixed up (confused).  Being tired and grouchy (irritable).  A fever.  Watery poop (diarrhea). How is this treated? This condition may be treated with:  Antibiotic medicine.  Other medicines.  Drinking enough water. Follow these instructions at home:  Medicines  Take over-the-counter and prescription  medicines only as told by your doctor.  If you were prescribed an antibiotic medicine, take it as told by your doctor. Do not stop taking it even if you start to feel better. General instructions  Make sure you: ? Pee until your bladder is empty. ? Do not hold pee for a long time. ? Empty your bladder after sex. ? Wipe from front to back after pooping if you are a female. Use each tissue one time when you wipe.  Drink enough fluid to keep your pee pale yellow.  Keep all follow-up visits as told by your doctor. This is important. Contact a doctor if:  You do not get better after 1-2 days.  Your symptoms go away and then come back. Get help right away if:  You have very bad back pain.  You have very bad pain in your lower belly.  You have a fever.  You are sick to your stomach (nauseous).  You are throwing up. Summary  A urinary tract infection (UTI) is an infection of any part of the urinary tract.  This condition is caused by germs in your genital area.  There are many risk factors for a UTI. These include having a small, thin tube to drain pee and not being able to control when you pee or poop.  Treatment includes antibiotic medicines for germs.  Drink enough fluid to keep your pee pale yellow. This information is not intended to replace advice given to you by your health care provider. Make sure you discuss any questions you have with your health care provider. Document Revised: 01/29/2018 Document Reviewed: 08/21/2017 Elsevier Patient Education  2020 [employer].

## 2019-08-31 NOTE — Telephone Encounter (Signed)
Patient scheduled OV today with [facility]

## 2019-09-01 LAB — WET PREP FOR TRICH, YEAST, CLUE

## 2019-09-03 LAB — URINALYSIS, COMPLETE W/RFL CULTURE
Bilirubin Urine: NEGATIVE
Glucose, UA: NEGATIVE
Hgb urine dipstick: NEGATIVE
Hyaline Cast: NONE SEEN /LPF
Ketones, ur: NEGATIVE
Nitrites, Initial: NEGATIVE
Protein, ur: NEGATIVE
RBC / HPF: NONE SEEN /HPF (ref 0–2)
Specific Gravity, Urine: 1.015 (ref 1.001–1.03)
pH: 7.5 (ref 5.0–8.0)

## 2019-09-03 LAB — URINE CULTURE
MICRO NUMBER:: 10675241
Result:: NO GROWTH
SPECIMEN QUALITY:: ADEQUATE

## 2019-09-03 LAB — CULTURE INDICATED

## 2019-09-23 ENCOUNTER — Ambulatory Visit: Payer: No Typology Code available for payment source | Admitting: Nurse Practitioner

## 2019-09-24 ENCOUNTER — Telehealth: Payer: Self-pay | Admitting: *Deleted

## 2019-09-24 MED ORDER — FLUCONAZOLE 150 MG PO TABS: 150.0000 mg | ORAL_TABLET | Freq: Every day | ORAL | 1 refills | Status: DC

## 2019-09-24 NOTE — Telephone Encounter (Signed)
Please send Fluconazole 150 mg 1 tab PO daily x 3.  #3 refill x 1.

## 2019-09-24 NOTE — Telephone Encounter (Addendum)
Patient called c/o vaginal itching and white discharge with external irratation, asked if Rx could be sent? Was treated for BV and UTI on 08/31/19 with T[facility].  Please advise

## 2019-09-24 NOTE — Telephone Encounter (Signed)
Patient informed, Rx sent.  

## 2019-09-26 DIAGNOSIS — A5901 Trichomonal vulvovaginitis: Secondary | ICD-10-CM

## 2019-09-26 DIAGNOSIS — Z8619 Personal history of other infectious and parasitic diseases: Secondary | ICD-10-CM

## 2019-09-26 HISTORY — DX: Trichomonal vulvovaginitis: A59.01

## 2019-09-26 HISTORY — DX: Personal history of other infectious and parasitic diseases: Z86.19

## 2019-09-28 ENCOUNTER — Encounter: Payer: Self-pay | Admitting: Obstetrics & Gynecology

## 2019-09-28 ENCOUNTER — Ambulatory Visit (INDEPENDENT_AMBULATORY_CARE_PROVIDER_SITE_OTHER): Payer: No Typology Code available for payment source | Admitting: Obstetrics & Gynecology

## 2019-09-28 ENCOUNTER — Other Ambulatory Visit: Payer: Self-pay

## 2019-09-28 VITALS — BP 128/88

## 2019-09-28 DIAGNOSIS — Z113 Encounter for screening for infections with a predominantly sexual mode of transmission: Secondary | ICD-10-CM

## 2019-09-28 DIAGNOSIS — A599 Trichomoniasis, unspecified: Secondary | ICD-10-CM | POA: Diagnosis not present

## 2019-09-28 DIAGNOSIS — N898 Other specified noninflammatory disorders of vagina: Secondary | ICD-10-CM | POA: Diagnosis not present

## 2019-09-28 MED ORDER — METRONIDAZOLE 500 MG PO TABS: 500.0000 mg | ORAL_TABLET | Freq: Two times a day (BID) | ORAL | 0 refills | Status: AC

## 2019-09-28 NOTE — Progress Notes (Signed)
    Tonya Perkins 1980-11-01 [id number]        39 y.o.  G0  Married.  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  RP: Increased vaginal discharge with odor and irritation  HPI: Would like STI screen. Increased vaginal discharge with odor and irritation.  Mild pelvic discomfort. No fever.  No contraception, attempting conception.     OB History  Gravida Para Term Preterm AB Living  0 0 0 0 0 0  SAB TAB Ectopic Multiple Live Births  0 0 0 0 0    Past medical history,surgical history, problem list, medications, allergies, family history and social history were all reviewed and documented in the EPIC chart.   Directed ROS with pertinent positives and negatives documented in the history of present illness/assessment and plan.  Exam:  Vitals:   09/28/19 1219  BP: 128/88   General appearance:  Normal  Abdomen: Normal  Gynecologic exam: Vulva normal.  Speculum:  Cervix/Vagina wnl.  Increased vaginal d/c.  Wet prep done.  Gono-Chlam done.  Bimanual exam:  Uterus AV, mobile, NT, mobile.  No adnexal mass/NT.  Wet Prep:  Trichomonas and Clue Cells present.   Assessment/Plan:  39 y.o. G0  1. Vaginal discharge Confirmed Trichomonas vaginalis and Bacterial Vaginosis by Wet Prep.  Patient counseled on Trichomonas which is a STI.  Full STI screen done today.  Will treat with Flagyl 500 mg PO BID x 7 days.  Usage reviewed and prescription sent to pharmacy. - WET PREP FOR [place], YEAST, CLUE  2. Screen for STD (sexually transmitted disease) Strict condom use recommended. - HIV antibody (with reflex) - Hepatitis C Antibody - Hepatitis B Surface AntiGEN - RPR - C. trachomatis/N. gonorrhoeae RNA  3. Trichomonas vaginalis infection As above.  Treat husband through his Fam MD.  Condom use.  TOC to schedule.  Other orders - metroNIDAZOLE (FLAGYL) 500 MG tablet; Take 1 tablet (500 mg total) by mouth 2 (two) times daily for 7 days.  [name] MD, 12:44 PM 09/28/2019

## 2019-09-29 LAB — WET PREP FOR TRICH, YEAST, CLUE

## 2019-09-29 LAB — C. TRACHOMATIS/N. GONORRHOEAE RNA
C. trachomatis RNA, TMA: NOT DETECTED
N. gonorrhoeae RNA, TMA: NOT DETECTED

## 2019-09-29 LAB — HEPATITIS C ANTIBODY
Hepatitis C Ab: NONREACTIVE
SIGNAL TO CUT-OFF: 0.01 (ref <1.00)

## 2019-09-29 LAB — HEPATITIS B SURFACE ANTIGEN: Hepatitis B Surface Ag: NONREACTIVE

## 2019-09-29 LAB — HIV ANTIBODY (ROUTINE TESTING W REFLEX): HIV 1&2 Ab, 4th Generation: NONREACTIVE

## 2019-09-29 LAB — RPR: RPR Ser Ql: NONREACTIVE

## 2019-09-30 ENCOUNTER — Telehealth: Payer: Self-pay | Admitting: *Deleted

## 2019-09-30 MED ORDER — FLUCONAZOLE 150 MG PO TABS: 150.0000 mg | ORAL_TABLET | Freq: Every day | ORAL | 1 refills | Status: DC

## 2019-09-30 NOTE — Telephone Encounter (Signed)
Pt currently being treated for Trich and now complains of itching on outside. Ok per ML Diflucan 150mg  #1 1 Refill. KW CMA/ML

## 2019-10-01 ENCOUNTER — Encounter: Payer: Self-pay | Admitting: Obstetrics & Gynecology

## 2019-10-02 ENCOUNTER — Encounter: Payer: Self-pay | Admitting: Family Medicine

## 2019-10-06 ENCOUNTER — Emergency Department (HOSPITAL_COMMUNITY)
Admission: EM | Admit: 2019-10-06 | Discharge: 2019-10-06 | Disposition: A | Discharge status: Home / Self Care | Attending: Emergency Medicine | Admitting: Emergency Medicine

## 2019-10-06 ENCOUNTER — Other Ambulatory Visit: Payer: Self-pay

## 2019-10-06 ENCOUNTER — Encounter (HOSPITAL_COMMUNITY): Payer: Self-pay

## 2019-10-06 DIAGNOSIS — Z5321 Procedure and treatment not carried out due to patient leaving prior to being seen by health care provider: Secondary | ICD-10-CM | POA: Diagnosis not present

## 2019-10-06 DIAGNOSIS — R1032 Left lower quadrant pain: Secondary | ICD-10-CM | POA: Diagnosis not present

## 2019-10-06 DIAGNOSIS — R112 Nausea with vomiting, unspecified: Secondary | ICD-10-CM | POA: Diagnosis not present

## 2019-10-06 LAB — CBC
HCT: 43.3 % (ref 36.0–46.0)
Hemoglobin: 14.9 g/dL (ref 12.0–15.0)
MCH: 33.4 pg (ref 26.0–34.0)
MCHC: 34.4 g/dL (ref 30.0–36.0)
MCV: 97.1 fL (ref 80.0–100.0)
Platelets: 347 10*3/uL (ref 150–400)
RBC: 4.46 MIL/uL (ref 3.87–5.11)
RDW: 12.5 % (ref 11.5–15.5)
WBC: 6.3 10*3/uL (ref 4.0–10.5)
nRBC: 0 % (ref 0.0–0.2)

## 2019-10-06 LAB — COMPREHENSIVE METABOLIC PANEL
ALT: 33 U/L (ref 0–44)
AST: 29 U/L (ref 15–41)
Albumin: 4.4 g/dL (ref 3.5–5.0)
Alkaline Phosphatase: 56 U/L (ref 38–126)
Anion gap: 10 (ref 5–15)
BUN: 10 mg/dL (ref 6–20)
CO2: 22 mmol/L (ref 22–32)
Calcium: 9.2 mg/dL (ref 8.9–10.3)
Chloride: 107 mmol/L (ref 98–111)
Creatinine, Ser: 0.98 mg/dL (ref 0.44–1.00)
GFR calc Af Amer: >60 mL/min (ref >60)
GFR calc non Af Amer: >60 mL/min (ref >60)
Glucose, Bld: 149 mg/dL — ABNORMAL HIGH (ref 70–99)
Potassium: 3.9 mmol/L (ref 3.5–5.1)
Sodium: 139 mmol/L (ref 135–145)
Total Bilirubin: 0.5 mg/dL (ref 0.3–1.2)
Total Protein: 7.7 g/dL (ref 6.5–8.1)

## 2019-10-06 LAB — I-STAT BETA HCG BLOOD, ED (MC, WL, AP ONLY): I-stat hCG, quantitative: <5 m[IU]/mL (ref <5)

## 2019-10-06 LAB — LIPASE, BLOOD: Lipase: 36 U/L (ref 11–51)

## 2019-10-06 MED ORDER — ONDANSETRON 4 MG PO TBDP
4.0000 mg | ORAL_TABLET | Freq: Once | ORAL | Status: AC | PRN
  Administered 2019-10-06: 4 mg via ORAL
  Filled 2019-10-06: qty 1

## 2019-10-06 NOTE — ED Triage Notes (Signed)
Patient arrived stating she woke up about two hours feeling nauseated.  Reports vomiting x4 prior to arrival. States she is having lower left abdominal pain.

## 2019-11-12 ENCOUNTER — Ambulatory Visit: Payer: No Typology Code available for payment source | Admitting: Medical

## 2019-12-01 ENCOUNTER — Other Ambulatory Visit (HOSPITAL_COMMUNITY): Payer: Self-pay | Admitting: Allergy

## 2019-12-01 MED FILL — BREO ELLIPTA 200-25 MCG INH: 200-25 | 30 days supply | Qty: 60 | Fill #0

## 2019-12-01 MED FILL — ALBUTEROL SULFATE HFA 108 (: 108 (90 BAS | 50 days supply | Qty: 26 | Fill #0

## 2020-01-30 NOTE — Progress Notes (Signed)
Chief Complaint  Patient presents with  . Hypertension    bp has been running high over the last couple weeks.    Patient presents with complaint of high BP's in the last 2-2.5 weeks.  She feels this is related to stress (legal, related to husband and work stress), poor sleep. 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  She had more salt, saw the effect on her BP, so is cutting back on that.  Trying to use air fryer.  Once she started making these changes and starting apple cider vinegar, she noted improvement in BP's. BP's last week 131/101, 145/101, and 151/113 was this past Friday, when she had pounding headache. She started the dietary changes just last week. Since Friday, BP 121/88, 113/80.  Trying to incorporate more meditation for stress reduction. Working 14 hours/day, unable to exercise during the week. Plans to start exercise on the weekends, like she did in the summer.   PMH, PSH, SH reviewed  Outpatient Encounter Medications as of 01/31/2020  Medication Sig Note  . albuterol (PROVENTIL HFA;VENTOLIN HFA) 108 (90 BASE) MCG/ACT inhaler 2 puffs q 4 hrs-(PT NEEDS PROVENTIL NO VENTOLIN) 07/12/2019: Needing 1-2x/d for the last 2 weeks, allergies flaring  . cholecalciferol (VITAMIN D3) 25 MCG (1000 UNIT) tablet Take 1,000 Units by mouth daily.   . fexofenadine (ALLEGRA) 180 MG tablet Take 180 mg by mouth daily.   . fluticasone (FLONASE) 50 MCG/ACT nasal spray Place 2 sprays into both nostrils daily.   . fluticasone furoate-vilanterol (BREO ELLIPTA) 200-25 MCG/INH AEPB Inhale 1 puff into the lungs daily.   . montelukast (SINGULAIR) 10 MG tablet Take 10 mg by mouth at bedtime.   . Multiple Vitamins-Minerals (MULTIVITAMIN WITH MINERALS) tablet Take 1 tablet by mouth daily.   . NON FORMULARY Take 1 capsule by mouth daily. 07/12/2019: [employer]  . NON FORMULARY Take 1 capsule by mouth daily. 07/12/2019: Black Seed Oil  . ADDERALL XR 5 MG 24 hr capsule Take by mouth. (Patient not taking:  Reported on 01/31/2020) 01/31/2020: Only when at school  . valACYclovir (VALTREX) 500 MG tablet Take 1 tablet (500 mg total) by mouth daily. Prophylaxis (Patient not taking: Reported on 01/31/2020) 01/31/2020: As needed  . [DISCONTINUED] busPIRone (BUSPAR) 5 MG tablet Take 5 mg by mouth 2 (two) times daily.    . [DISCONTINUED] fluconazole (DIFLUCAN) 150 MG tablet Take 1 tablet (150 mg total) by mouth daily.   . [DISCONTINUED] Fluticasone Furoate-Vilanterol (BREO ELLIPTA) 100-25 MCG/INH AEPB Inhale 1 puff into the lungs daily. 07/12/2019: She reports she is on the 200 dose of Breo, not 100  . [DISCONTINUED] REGLAN 10 MG tablet Take by mouth.   . [DISCONTINUED] Vitamin D, Ergocalciferol, (DRISDOL) 1.25 MG (50000 UNIT) CAPS capsule Take 1 capsule (50,000 Units total) by mouth every 7 (seven) days.    No facility-administered encounter medications on file as of 01/31/2020.   No Known Allergies  ROS: no fever, chills, URI symptoms.  No chest pain, shortness of breath, edema. No known snoring.  Some nights she doesn't sleep well, doesn't sleep enough, and may wake up with a headache.  Only getting 6 hours/sleep No numbness, tingling, weakness or other neuro symptoms   PHYSICAL EXAM:  BP (!) 132/98   Pulse 84   Ht 5\' 5"  (1.651 m)   Wt 216 lb (98 kg)   LMP 01/11/2020   BMI 35.94 kg/m   Pt's wrist monitor (L wrist) 133/100 P 87 Repeat BP by MD 132/94 on  repeat by MD  Pleasant, well-appearing female in good spirits, in no distress HEENT: conjunctiva and sclera are clear, EOMI, wearing mask Neck: no lymphadenopathy, thyromegaly or mass, no carotid bruit Heart: regular rate and rhythm, no murmur Lungs: clear bilaterally Back: no spinal or CVA tenderness Abdomen: soft, nontender, no organomegaly or mass, no bruit Extremities: no edema Psych: normal mood, affect, hygiene and grooming Neuro: alert and oriented, normal strength, gait.  ASSESSMENT/PLAN:  Elevated BP without diagnosis of  hypertension  Need for influenza vaccination - Plan: Flu Vaccine QUAD 6+ mos PF IM (Fluarix Quad PF)  Need for viral immunization - Plan: Pfizer SARS-COV-2 Vaccine   Discussed low sodium diet, need for at least 6 hours of sleep (ideally 7-9), regular exercise, stress reduction. Discussed BP goals, and the potential to start HCTZ if they remain above goal. Seems like she had good response to some changes she made. Given sheet to record her BP, and can send via MyChart for my review.  Counseled re: side effects of vaccines,  F/u May as scheduled, sooner if BP's remain persistently elevated Plan EKG at f/u visit.  I spent 35 minutes dedicated to the care of this patient, including pre-visit review of records, face to face time, post-visit ordering of testing and documentation.

## 2020-01-31 ENCOUNTER — Other Ambulatory Visit: Payer: Self-pay

## 2020-01-31 ENCOUNTER — Encounter: Payer: Self-pay | Admitting: Family Medicine

## 2020-01-31 ENCOUNTER — Ambulatory Visit (INDEPENDENT_AMBULATORY_CARE_PROVIDER_SITE_OTHER): Admitting: Family Medicine

## 2020-01-31 ENCOUNTER — Telehealth: Payer: Self-pay | Admitting: *Deleted

## 2020-01-31 VITALS — BP 132/98 | HR 84 | Ht 65.0 in | Wt 216.0 lb

## 2020-01-31 DIAGNOSIS — Z23 Encounter for immunization: Secondary | ICD-10-CM

## 2020-01-31 DIAGNOSIS — R030 Elevated blood-pressure reading, without diagnosis of hypertension: Secondary | ICD-10-CM

## 2020-01-31 MED ORDER — VALACYCLOVIR HCL 500 MG PO TABS
500.0000 mg | ORAL_TABLET | Freq: Every day | ORAL | 0 refills | Status: DC
Start: 2020-01-31 — End: 2021-02-13

## 2020-01-31 NOTE — Patient Instructions (Addendum)
We discussed working on stress reduction, regular exercise. Try and sleep 7-9 hours/night (okay to make up for lost sleep on the weekends if unable to get that much during the week--try and at LEAST get 6 hours when working.  Continue to work on low sodium diet.  Goal BP is under 130/80.  Up to 135/85 is okay for now. If consistently 140/90 or higher (either the top or bottom number), and doesn't come down, then schedule an appointment sooner than May (or email me a copy of your blood pressure list and I'm happy to advise at any time)   [place] stands for "Dietary Approaches to Stop Hypertension." The DASH eating plan is a healthy eating plan that has been shown to reduce high blood pressure (hypertension). It may also reduce your risk for type 2 diabetes, heart disease, and stroke. The DASH eating plan may also help with weight loss. What are tips for following this plan?  General guidelines  Avoid eating more than 2,300 mg (milligrams) of salt (sodium) a day. If you have hypertension, you may need to reduce your sodium intake to 1,500 mg a day.  Limit alcohol intake to no more than 1 drink a day for nonpregnant women and 2 drinks a day for men. One drink equals 12 oz of beer, 5 oz of wine, or 1 oz of hard liquor.  Work with your health care provider to maintain a healthy body weight or to lose weight. Ask what an ideal weight is for you.  Get at least 30 minutes of exercise that causes your heart to beat faster (aerobic exercise) most days of the week. Activities may include walking, swimming, or biking.  Work with your health care provider or diet and nutrition specialist (dietitian) to adjust your eating plan to your individual calorie needs. Reading food labels   Check food labels for the amount of sodium per serving. Choose foods with less than 5 percent of the Daily Value of sodium. Generally, foods with less than 300 mg of sodium per serving fit into this eating  plan.  To find whole grains, look for the word "whole" as the first word in the ingredient list. Shopping  Buy products labeled as "low-sodium" or "no salt added."  Buy fresh foods. Avoid canned foods and premade or frozen meals. Cooking  Avoid adding salt when cooking. Use salt-free seasonings or herbs instead of table salt or sea salt. Check with your health care provider or pharmacist before using salt substitutes.  Do not fry foods. Cook foods using healthy methods such as baking, boiling, grilling, and broiling instead.  Cook with heart-healthy oils, such as olive, canola, soybean, or sunflower oil. Meal planning  Eat a balanced diet that includes: ? 5 or more servings of fruits and vegetables each day. At each meal, try to fill half of your plate with fruits and vegetables. ? Up to 6-8 servings of whole grains each day. ? Less than 6 oz of lean meat, poultry, or fish each day. A 3-oz serving of meat is about the same size as a deck of cards. One egg equals 1 oz. ? 2 servings of low-fat dairy each day. ? A serving of nuts, seeds, or beans 5 times each week. ? Heart-healthy fats. Healthy fats called Omega-3 fatty acids are found in foods such as flaxseeds and coldwater fish, like sardines, salmon, and mackerel.  Limit how much you eat of the following: ? Canned or prepackaged foods. ? Food that  is high in trans fat, such as fried foods. ? Food that is high in saturated fat, such as fatty meat. ? Sweets, desserts, sugary drinks, and other foods with added sugar. ? Full-fat dairy products.  Do not salt foods before eating.  Try to eat at least 2 vegetarian meals each week.  Eat more home-cooked food and less restaurant, buffet, and fast food.  When eating at a restaurant, ask that your food be prepared with less salt or no salt, if possible. What foods are recommended? The items listed may not be a complete list. Talk with your dietitian about what dietary choices are best  for you. Grains Whole-grain or whole-wheat bread. Whole-grain or whole-wheat pasta. Brown rice. Oatmeal. Quinoa. Bulgur. Whole-grain and low-sodium cereals. Pita bread. Low-fat, low-sodium crackers. Whole-wheat flour tortillas. Vegetables Fresh or frozen vegetables (raw, steamed, roasted, or grilled). Low-sodium or reduced-sodium tomato and vegetable juice. Low-sodium or reduced-sodium tomato sauce and tomato paste. Low-sodium or reduced-sodium canned vegetables. Fruits All fresh, dried, or frozen fruit. Canned fruit in natural juice (without added sugar). Meat and other protein foods Skinless chicken or turkey. Ground chicken or turkey. Pork with fat trimmed off. Fish and seafood. Egg whites. Dried beans, peas, or lentils. Unsalted nuts, nut butters, and seeds. Unsalted canned beans. Lean cuts of beef with fat trimmed off. Low-sodium, lean deli meat. Dairy Low-fat (1%) or fat-free (skim) milk. Fat-free, low-fat, or reduced-fat cheeses. Nonfat, low-sodium ricotta or cottage cheese. Low-fat or nonfat yogurt. Low-fat, low-sodium cheese. Fats and oils Soft margarine without trans fats. Vegetable oil. Low-fat, reduced-fat, or light mayonnaise and salad dressings (reduced-sodium). Canola, safflower, olive, soybean, and sunflower oils. Avocado. Seasoning and other foods Herbs. Spices. Seasoning mixes without salt. Unsalted popcorn and pretzels. Fat-free sweets. What foods are not recommended? The items listed may not be a complete list. Talk with your dietitian about what dietary choices are best for you. Grains Baked goods made with fat, such as croissants, muffins, or some breads. Dry pasta or rice meal packs. Vegetables Creamed or fried vegetables. Vegetables in a cheese sauce. Regular canned vegetables (not low-sodium or reduced-sodium). Regular canned tomato sauce and paste (not low-sodium or reduced-sodium). Regular tomato and vegetable juice (not low-sodium or reduced-sodium). Pickles.  Olives. Fruits Canned fruit in a light or heavy syrup. Fried fruit. Fruit in cream or butter sauce. Meat and other protein foods Fatty cuts of meat. Ribs. Fried meat. Bacon. Sausage. Bologna and other processed lunch meats. Salami. Fatback. Hotdogs. Bratwurst. Salted nuts and seeds. Canned beans with added salt. Canned or smoked fish. Whole eggs or egg yolks. Chicken or turkey with skin. Dairy Whole or 2% milk, cream, and half-and-half. Whole or full-fat cream cheese. Whole-fat or sweetened yogurt. Full-fat cheese. Nondairy creamers. Whipped toppings. Processed cheese and cheese spreads. Fats and oils Butter. Stick margarine. Lard. Shortening. Ghee. Bacon fat. Tropical oils, such as coconut, palm kernel, or palm oil. Seasoning and other foods Salted popcorn and pretzels. Onion salt, garlic salt, seasoned salt, table salt, and sea salt. Worcestershire sauce. Tartar sauce. Barbecue sauce. Teriyaki sauce. Soy sauce, including reduced-sodium. Steak sauce. Canned and packaged gravies. Fish sauce. Oyster sauce. Cocktail sauce. Horseradish that you find on the shelf. Ketchup. Mustard. Meat flavorings and tenderizers. Bouillon cubes. Hot sauce and Tabasco sauce. Premade or packaged marinades. Premade or packaged taco seasonings. Relishes. Regular salad dressings. Where to find more information:  National Heart, Lung, and Blood Institute: www.nhlbi.nih.gov  American Heart Association: www.heart.org Summary  The DASH eating plan is a healthy   eating plan that has been shown to reduce high blood pressure (hypertension). It may also reduce your risk for type 2 diabetes, heart disease, and stroke.  With the DASH eating plan, you should limit salt (sodium) intake to 2,300 mg a day. If you have hypertension, you may need to reduce your sodium intake to 1,500 mg a day.  When on the DASH eating plan, aim to eat more fresh fruits and vegetables, whole grains, lean proteins, low-fat dairy, and heart-healthy  fats.  Work with your health care provider or diet and nutrition specialist (dietitian) to adjust your eating plan to your individual calorie needs. This information is not intended to replace advice given to you by your health care provider. Make sure you discuss any questions you have with your health care provider. Document Revised: 01/24/2017 Document Reviewed: 02/05/2016 Elsevier Patient Education  2020 [employer].

## 2020-01-31 NOTE — Telephone Encounter (Signed)
Patient called requesting refill on Valtrex 500 mg tablet, annual exam due in April 2022. Rx sent.

## 2020-02-21 ENCOUNTER — Other Ambulatory Visit: Payer: Self-pay

## 2020-02-21 ENCOUNTER — Encounter: Payer: Self-pay | Admitting: Nurse Practitioner

## 2020-02-21 ENCOUNTER — Ambulatory Visit (INDEPENDENT_AMBULATORY_CARE_PROVIDER_SITE_OTHER): Admitting: Nurse Practitioner

## 2020-02-21 VITALS — BP 126/80

## 2020-02-21 DIAGNOSIS — N898 Other specified noninflammatory disorders of vagina: Secondary | ICD-10-CM

## 2020-02-21 DIAGNOSIS — B9689 Other specified bacterial agents as the cause of diseases classified elsewhere: Secondary | ICD-10-CM

## 2020-02-21 DIAGNOSIS — N760 Acute vaginitis: Secondary | ICD-10-CM

## 2020-02-21 LAB — WET PREP FOR TRICH, YEAST, CLUE

## 2020-02-21 MED ORDER — METRONIDAZOLE 0.75 % VA GEL: 1.0000 | Freq: Two times a day (BID) | VAGINAL | 0 refills | Status: AC

## 2020-02-21 NOTE — Progress Notes (Signed)
   Acute Office Visit  Subjective:    Patient ID: Tonya Perkins, female    DOB: 03-15-1980, 39 y.o.   MRN: [id number]   HPI 39 y.o. presents today for vaginal discharge and odor. Denies urinary symptoms.    Review of Systems  Constitutional: Negative.   Genitourinary: Positive for vaginal discharge.       Vaginal odor       Objective:    Physical Exam Constitutional:      Appearance: Normal appearance.  Genitourinary:    General: Normal vulva.     Vagina: Vaginal discharge present. No erythema.     BP 126/80 (BP Location: Right Arm, Patient Position: Sitting, Cuff Size: Normal)  Wt Readings from Last 3 Encounters:  01/31/20 216 lb (98 kg)  07/12/19 220 lb 12.8 oz (100.2 kg)  06/03/19 221 lb (100.2 kg)   Wet prep + clue cells     Assessment & Plan:   Problem List Items Addressed This Visit   None   Visit Diagnoses    Bacterial vaginosis    -  Primary   Relevant Medications   metroNIDAZOLE (METROGEL) 0.75 % vaginal gel   Vaginal discharge       Relevant Orders   WET PREP FOR [place], YEAST, CLUE     Plan: Wet prep positive for clue cells. Metrogel 0.75% twice daily x 4 days. If symptoms worsen or do not improve she will return to office. She is agreeable to plan.      Olivia Mackie [facility], 12:42 PM 02/21/2020

## 2020-02-21 NOTE — Patient Instructions (Signed)
Bacterial Vaginosis  Bacterial vaginosis is a vaginal infection that occurs when the normal balance of bacteria in the vagina is disrupted. It results from an overgrowth of certain bacteria. This is the most common vaginal infection among women ages 15-44. Because bacterial vaginosis increases your risk for STIs (sexually transmitted infections), getting treated can help reduce your risk for chlamydia, gonorrhea, herpes, and HIV (human immunodeficiency virus). Treatment is also important for preventing complications in pregnant women, because this condition can cause an early (premature) delivery. What are the causes? This condition is caused by an increase in harmful bacteria that are normally present in small amounts in the vagina. However, the reason that the condition develops is not fully understood. What increases the risk? The following factors may make you more likely to develop this condition:  Having a new sexual partner or multiple sexual partners.  Having unprotected sex.  Douching.  Having an intrauterine device (IUD).  Smoking.  Drug and alcohol abuse.  Taking certain antibiotic medicines.  Being pregnant. You cannot get bacterial vaginosis from toilet seats, bedding, swimming pools, or contact with objects around you. What are the signs or symptoms? Symptoms of this condition include:  Grey or white vaginal discharge. The discharge can also be watery or foamy.  A fish-like odor with discharge, especially after sexual intercourse or during menstruation.  Itching in and around the vagina.  Burning or pain with urination. Some women with bacterial vaginosis have no signs or symptoms. How is this diagnosed? This condition is diagnosed based on:  Your medical history.  A physical exam of the vagina.  Testing a sample of vaginal fluid under a microscope to look for a large amount of bad bacteria or abnormal cells. Your health care provider may use a cotton swab or  a small wooden spatula to collect the sample. How is this treated? This condition is treated with antibiotics. These may be given as a pill, a vaginal cream, or a medicine that is put into the vagina (suppository). If the condition comes back after treatment, a second round of antibiotics may be needed. Follow these instructions at home: Medicines  Take over-the-counter and prescription medicines only as told by your health care provider.  Take or use your antibiotic as told by your health care provider. Do not stop taking or using the antibiotic even if you start to feel better. General instructions  If you have a female sexual partner, tell her that you have a vaginal infection. She should see her health care provider and be treated if she has symptoms. If you have a female sexual partner, he does not need treatment.  During treatment: ? Avoid sexual activity until you finish treatment. ? Do not douche. ? Avoid alcohol as directed by your health care provider. ? Avoid breastfeeding as directed by your health care provider.  Drink enough water and fluids to keep your urine clear or pale yellow.  Keep the area around your vagina and rectum clean. ? Wash the area daily with warm water. ? Wipe yourself from front to back after using the toilet.  Keep all follow-up visits as told by your health care provider. This is important. How is this prevented?  Do not douche.  Wash the outside of your vagina with warm water only.  Use protection when having sex. This includes latex condoms and dental dams.  Limit how many sexual partners you have. To help prevent bacterial vaginosis, it is best to have sex with just one partner (  monogamous).  Make sure you and your sexual partner are tested for STIs.  Wear cotton or cotton-lined underwear.  Avoid wearing tight pants and pantyhose, especially during summer.  Limit the amount of alcohol that you drink.  Do not use any products that contain  nicotine or tobacco, such as cigarettes and e-cigarettes. If you need help quitting, ask your health care provider.  Do not use illegal drugs. Where to find more information  Centers for Disease Control and Prevention: www.cdc.gov/std  American Sexual Health Association (ASHA): www.ashastd.org  U.S. Department of Health and Human Services, Office on Women's Health: www.womenshealth.gov/ or https://www.womenshealth.gov/a-z-topics/bacterial-vaginosis Contact a health care provider if:  Your symptoms do not improve, even after treatment.  You have more discharge or pain when urinating.  You have a fever.  You have pain in your abdomen.  You have pain during sex.  You have vaginal bleeding between periods. Summary  Bacterial vaginosis is a vaginal infection that occurs when the normal balance of bacteria in the vagina is disrupted.  Because bacterial vaginosis increases your risk for STIs (sexually transmitted infections), getting treated can help reduce your risk for chlamydia, gonorrhea, herpes, and HIV (human immunodeficiency virus). Treatment is also important for preventing complications in pregnant women, because the condition can cause an early (premature) delivery.  This condition is treated with antibiotic medicines. These may be given as a pill, a vaginal cream, or a medicine that is put into the vagina (suppository). This information is not intended to replace advice given to you by your health care provider. Make sure you discuss any questions you have with your health care provider. Document Revised: 01/24/2017 Document Reviewed: 10/28/2015 Elsevier Patient Education  2020 [employer] Inc.  

## 2020-03-20 ENCOUNTER — Ambulatory Visit (INDEPENDENT_AMBULATORY_CARE_PROVIDER_SITE_OTHER): Admitting: Nurse Practitioner

## 2020-03-20 ENCOUNTER — Encounter: Payer: Self-pay | Admitting: Nurse Practitioner

## 2020-03-20 ENCOUNTER — Ambulatory Visit: Payer: Self-pay

## 2020-03-20 ENCOUNTER — Other Ambulatory Visit: Payer: Self-pay

## 2020-03-20 DIAGNOSIS — N760 Acute vaginitis: Secondary | ICD-10-CM | POA: Diagnosis not present

## 2020-03-20 DIAGNOSIS — R350 Frequency of micturition: Secondary | ICD-10-CM

## 2020-03-20 DIAGNOSIS — N898 Other specified noninflammatory disorders of vagina: Secondary | ICD-10-CM | POA: Diagnosis not present

## 2020-03-20 LAB — WET PREP FOR TRICH, YEAST, CLUE

## 2020-03-20 MED ORDER — HYDROCORTISONE 1 % EX OINT: 1.0000 "application " | TOPICAL_OINTMENT | Freq: Two times a day (BID) | CUTANEOUS | 0 refills | Status: DC

## 2020-03-20 NOTE — Progress Notes (Signed)
   Acute Office Visit  Subjective:    Patient ID: Tonya Perkins, female    DOB: 1980/07/05, 40 y.o.   MRN: [id number]   HPI 40 y.o. presents today for vaginal discharge and itching at introitus that started a few days ago. She took a Diflucan without improvement. Not currently sexually active but uses toys. She says she cleans them frequently due to her history of recurrent infections. She also has mild urinary frequency that started today.    Review of Systems  Constitutional: Negative.   Genitourinary: Positive for frequency and vaginal discharge. Negative for dysuria and urgency. Vaginal pain: itching.       Objective:    Physical Exam Constitutional:      Appearance: Normal appearance.  Genitourinary:    General: Normal vulva.     Vagina: Normal.     Cervix: Normal.     There were no vitals taken for this visit. Wt Readings from Last 3 Encounters:  01/31/20 216 lb (98 kg)  07/12/19 220 lb 12.8 oz (100.2 kg)  06/03/19 221 lb (100.2 kg)   UA blood 2+, leukocytes 1+, WBC 6-10, few bacteria Wet prep negative    Assessment & Plan:   Problem List Items Addressed This Visit   None   Visit Diagnoses    Acute vaginitis    -  Primary   Relevant Medications   hydrocortisone 1 % ointment   Urinary frequency       Relevant Orders   Urinalysis,Complete w/RFL Culture   Vaginal itching       Relevant Orders   WET PREP FOR [place], YEAST, CLUE     Plan: Reassurance provided on normal exam and wet prep. Urine culture pending and will triage based on results. Recommend using lubrication with toys to minimize friction/irritation and making sure to continue cleaning with antibacterial soap after each use. Hydrocortisone 1% ointment applied externally twice daily for 7-10 days. If symptoms worsen or do not improve she will return office.  She is agreeable to plan.     [place], 11:01 AM 03/20/2020

## 2020-03-22 ENCOUNTER — Other Ambulatory Visit: Payer: Self-pay | Admitting: Nurse Practitioner

## 2020-03-22 DIAGNOSIS — B955 Unspecified streptococcus as the cause of diseases classified elsewhere: Secondary | ICD-10-CM

## 2020-03-22 DIAGNOSIS — N390 Urinary tract infection, site not specified: Secondary | ICD-10-CM

## 2020-03-22 LAB — URINALYSIS, COMPLETE W/RFL CULTURE
Bilirubin Urine: NEGATIVE
Glucose, UA: NEGATIVE
Hyaline Cast: NONE SEEN /LPF
Ketones, ur: NEGATIVE
Nitrites, Initial: NEGATIVE
Protein, ur: NEGATIVE
Specific Gravity, Urine: 1.01 (ref 1.001–1.03)
pH: 6 (ref 5.0–8.0)

## 2020-03-22 LAB — URINE CULTURE
MICRO NUMBER:: 11448427
SPECIMEN QUALITY:: ADEQUATE

## 2020-03-22 LAB — CULTURE INDICATED

## 2020-03-22 MED ORDER — AMOXICILLIN-POT CLAVULANATE 875-125 MG PO TABS: 1.0000 | ORAL_TABLET | Freq: Two times a day (BID) | ORAL | 0 refills | Status: AC

## 2020-06-06 ENCOUNTER — Other Ambulatory Visit (HOSPITAL_COMMUNITY)
Admission: RE | Admit: 2020-06-06 | Discharge: 2020-06-06 | Disposition: A | Discharge status: Home / Self Care | Source: Ambulatory Visit | Attending: Obstetrics & Gynecology | Admitting: Obstetrics & Gynecology

## 2020-06-06 ENCOUNTER — Encounter: Admitting: Obstetrics & Gynecology

## 2020-06-06 ENCOUNTER — Other Ambulatory Visit: Payer: Self-pay

## 2020-06-06 ENCOUNTER — Ambulatory Visit (INDEPENDENT_AMBULATORY_CARE_PROVIDER_SITE_OTHER): Admitting: Obstetrics & Gynecology

## 2020-06-06 ENCOUNTER — Encounter: Payer: Self-pay | Admitting: Obstetrics & Gynecology

## 2020-06-06 VITALS — BP 126/84 | Ht 65.0 in | Wt 213.0 lb

## 2020-06-06 DIAGNOSIS — Z3009 Encounter for other general counseling and advice on contraception: Secondary | ICD-10-CM

## 2020-06-06 DIAGNOSIS — R350 Frequency of micturition: Secondary | ICD-10-CM

## 2020-06-06 DIAGNOSIS — Z113 Encounter for screening for infections with a predominantly sexual mode of transmission: Secondary | ICD-10-CM | POA: Diagnosis not present

## 2020-06-06 DIAGNOSIS — E6609 Other obesity due to excess calories: Secondary | ICD-10-CM

## 2020-06-06 DIAGNOSIS — Z01419 Encounter for gynecological examination (general) (routine) without abnormal findings: Secondary | ICD-10-CM

## 2020-06-06 DIAGNOSIS — Z6835 Body mass index (BMI) 35.0-35.9, adult: Secondary | ICD-10-CM

## 2020-06-06 NOTE — Progress Notes (Signed)
Tonya Perkins 01/04/1981 [id number]   History:    40 y.o. 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  YQ:[id number] presenting for annual gyn exam   IRJ:JOACZY regular normal every month.  First episode of BTB at mid cycle this month.  Pelvic US 02/2019 Endometrial line normal, 2 small Fibroids SS and IM, normal ovaries. No Pelvic pain. Currently abstinent.  Breast normal.  C/O urinary frequency.  Bowel movements normal. Body mass index improved to 35.45.  Lost 8 Lbs x last year. Needs to exercise more. Health labs with family physician.  Past medical history,surgical history, family history and social history were all reviewed and documented in the EPIC chart.  Gynecologic History Patient's last menstrual period was 05/14/2020.  Obstetric History OB History  Gravida Para Term Preterm AB Living  0 0 0 0 0 0  SAB IAB Ectopic Multiple Live Births  0 0 0 0 0     ROS: A ROS was performed and pertinent positives and negatives are included in the history.  GENERAL: No fevers or chills. HEENT: No change in vision, no earache, sore throat or sinus congestion. NECK: No pain or stiffness. CARDIOVASCULAR: No chest pain or pressure. No palpitations. PULMONARY: No shortness of breath, cough or wheeze. GASTROINTESTINAL: No abdominal pain, nausea, vomiting or diarrhea, melena or bright red blood per rectum. GENITOURINARY: No urinary frequency, urgency, hesitancy or dysuria. MUSCULOSKELETAL: No joint or muscle pain, no back pain, no recent trauma. DERMATOLOGIC: No rash, no itching, no lesions. ENDOCRINE: No polyuria, polydipsia, no heat or cold intolerance. No recent change in weight. HEMATOLOGICAL: No anemia or easy bruising or bleeding. NEUROLOGIC: No headache, seizures, numbness, tingling or weakness. PSYCHIATRIC: No depression, no loss of interest in normal activity or change in sleep pattern.     Exam:   BP 126/84   Ht 5\' 5"  (1.651 m)   Wt 213 lb (96.6 kg)   LMP  05/14/2020 Comment: no birth control   BMI 35.45 kg/m   Body mass index is 35.45 kg/m.  General appearance : Well developed well nourished female. No acute distress HEENT: Eyes: no retinal hemorrhage or exudates,  Neck supple, trachea midline, no carotid bruits, no thyroidmegaly Lungs: Clear to auscultation, no rhonchi or wheezes, or rib retractions  Heart: Regular rate and rhythm, no murmurs or gallops Breast:Examined in sitting and supine position were symmetrical in appearance, no palpable masses or tenderness,  no skin retraction, no nipple inversion, no nipple discharge, no skin discoloration, no axillary or supraclavicular lymphadenopathy Abdomen: no palpable masses or tenderness, no rebound or guarding Extremities: no edema or skin discoloration or tenderness  Pelvic: Vulva: Normal             Vagina: No gross lesions or discharge  Cervix: No gross lesions or discharge.  Pap/HPV HR, Gono-Chlam done.  Uterus  AV, normal size, shape and consistency, non-tender and mobile  Adnexa  Without masses or tenderness  Anus: Normal  U/A: Yellow clear, Nit Neg, WBC NS, RBC 3-10, Bacteria few.  Pending U. Culture.   Assessment/Plan:  40 y.o. female for annual exam   1. Encounter for routine gynecological examination with Papanicolaou smear of cervix Normal gynecologic exam.  Pap test with high-risk HPV done.  Breast exam normal.  Health labs with family physician. - Cytology - PAP( [place])  2. Encounter for other general counseling or advice on contraception Currently abstinent.  Will use condoms as needed.  3. Screen for STD (sexually transmitted disease) - Gono-Chlam  on Pap - HIV antibody (with reflex) - RPR - Hepatitis B Surface AntiGEN - Hepatitis C Antibody  4. Frequency of urination U/A wnl, will wait on urine culture. - Urinalysis,Complete w/RFL Culture  5. Class 2 obesity due to excess calories without serious comorbidity with body mass index (BMI) of 35.0 to 35.9  in adult Patient lost 8 pounds since last year.  Recommend a lower calorie/carb diet.  Aerobic activities 5 times a week and light weightlifting every 2 days.  Other orders - Urine Culture - REFLEXIVE URINE CULTURE  [name] MD, 3:07 PM 06/06/2020

## 2020-06-07 LAB — URINALYSIS, COMPLETE W/RFL CULTURE
Bilirubin Urine: NEGATIVE
Glucose, UA: NEGATIVE
Hyaline Cast: NONE SEEN /LPF
Ketones, ur: NEGATIVE
Leukocyte Esterase: NEGATIVE
Nitrites, Initial: NEGATIVE
Protein, ur: NEGATIVE
Specific Gravity, Urine: 1.02 (ref 1.001–1.03)
WBC, UA: NONE SEEN /HPF (ref 0–5)
pH: 7 (ref 5.0–8.0)

## 2020-06-07 LAB — URINE CULTURE
MICRO NUMBER:: 11759999
SPECIMEN QUALITY:: ADEQUATE

## 2020-06-07 LAB — RPR: RPR Ser Ql: NONREACTIVE

## 2020-06-07 LAB — CULTURE INDICATED

## 2020-06-07 LAB — HIV ANTIBODY (ROUTINE TESTING W REFLEX): HIV 1&2 Ab, 4th Generation: NONREACTIVE

## 2020-06-07 LAB — HEPATITIS C ANTIBODY
Hepatitis C Ab: NONREACTIVE
SIGNAL TO CUT-OFF: 0 (ref <1.00)

## 2020-06-07 LAB — HEPATITIS B SURFACE ANTIGEN: Hepatitis B Surface Ag: NONREACTIVE

## 2020-06-08 LAB — CYTOLOGY - PAP
Chlamydia: NEGATIVE
Comment: NEGATIVE
Comment: NEGATIVE
Comment: NORMAL
Diagnosis: NEGATIVE
High risk HPV: NEGATIVE
Neisseria Gonorrhea: NEGATIVE

## 2020-07-12 ENCOUNTER — Encounter: Payer: No Typology Code available for payment source | Admitting: Family Medicine

## 2020-08-09 ENCOUNTER — Telehealth: Payer: Self-pay | Admitting: *Deleted

## 2020-08-09 NOTE — Telephone Encounter (Signed)
Patient called reports she has had recurrent BV several times, asked what do you recommend her to take as prophylactic to hopefully prevent this happening so frequently? Patient said she does not want to start on antibiotic. Any recommendations will be helpful for patient. Please advise

## 2020-08-10 ENCOUNTER — Encounter: Payer: Self-pay | Admitting: Family Medicine

## 2020-08-10 ENCOUNTER — Other Ambulatory Visit: Payer: Self-pay

## 2020-08-10 ENCOUNTER — Ambulatory Visit (INDEPENDENT_AMBULATORY_CARE_PROVIDER_SITE_OTHER): Admitting: Family Medicine

## 2020-08-10 VITALS — BP 136/98 | HR 93 | Temp 98.6°F | Wt 214.0 lb

## 2020-08-10 DIAGNOSIS — R102 Pelvic and perineal pain: Secondary | ICD-10-CM

## 2020-08-10 DIAGNOSIS — Z7251 High risk heterosexual behavior: Secondary | ICD-10-CM | POA: Diagnosis not present

## 2020-08-10 DIAGNOSIS — R1084 Generalized abdominal pain: Secondary | ICD-10-CM | POA: Diagnosis not present

## 2020-08-10 LAB — POCT URINALYSIS DIP (CLINITEK)
Bilirubin, UA: NEGATIVE
Glucose, UA: NEGATIVE mg/dL
Ketones, POC UA: NEGATIVE mg/dL
Leukocytes, UA: NEGATIVE
Nitrite, UA: NEGATIVE
POC PROTEIN,UA: NEGATIVE
Spec Grav, UA: 1.025 (ref 1.010–1.025)
Urobilinogen, UA: 0.2 E.U./dL
pH, UA: 6 (ref 5.0–8.0)

## 2020-08-10 NOTE — Patient Instructions (Signed)
Drink more water. Try and pay attention to which foods bother your stomach more (more raw vegetables, beans, greasy/fried foods, gluten, etc). Try and avoid triggering foods (vs taking Beano beforehand).  Take metamucil regularly (or other fiber supplement--citricel)--be sure to take with a lot of water. Eat a high fiber diet.  We discussed using Simethicone as needed when you have as pain, vs Beano prior to meals that usually cause the pain. I suspect that constipation is contributing.  More water, high fiber diet and regular exercise should help keep your bowels moving.   High-Fiber Eating Plan Fiber, also called dietary fiber, is a type of carbohydrate. It is found foods such as fruits, vegetables, whole grains, and beans. A high-fiber diet can have many health benefits. Your health care provider may recommend a high-fiber diet to help: Prevent constipation. Fiber can make your bowel movements more regular. Lower your cholesterol. Relieve the following conditions: Inflammation of veins in the anus (hemorrhoids). Inflammation of specific areas of the digestive tract (uncomplicated diverticulosis). A problem of the large intestine, also called the colon, that sometimes causes pain and diarrhea (irritable bowel syndrome, or IBS). Prevent overeating as part of a weight-loss plan. Prevent heart disease, type 2 diabetes, and certain cancers. What are tips for following this plan? Reading food labels  Check the nutrition facts label on food products for the amount of dietary fiber. Choose foods that have 5 grams of fiber or more per serving. The goals for recommended daily fiber intake include: Men (age 35 or younger): 34-38 g. Men (over age 64): 28-34 g. Women (age 44 or younger): 25-28 g. Women (over age 68): 22-25 g. Your daily fiber goal is _____________ g. Shopping Choose whole fruits and vegetables instead of processed forms, such as apple juice or applesauce. Choose a wide variety  of high-fiber foods such as avocados, lentils, oats, and kidney beans. Read the nutrition facts label of the foods you choose. Be aware of foods with added fiber. These foods often have high sugar and sodium amounts per serving. Cooking Use whole-grain flour for baking and cooking. Cook with brown rice instead of white rice. Meal planning Start the day with a breakfast that is high in fiber, such as a cereal that contains 5 g of fiber or more per serving. Eat breads and cereals that are made with whole-grain flour instead of refined flour or white flour. Eat brown rice, bulgur wheat, or millet instead of white rice. Use beans in place of meat in soups, salads, and pasta dishes. Be sure that half of the grains you eat each day are whole grains. General information You can get the recommended daily intake of dietary fiber by: Eating a variety of fruits, vegetables, grains, nuts, and beans. Taking a fiber supplement if you are not able to take in enough fiber in your diet. It is better to get fiber through food than from a supplement. Gradually increase how much fiber you consume. If you increase your intake of dietary fiber too quickly, you may have bloating, cramping, or gas. Drink plenty of water to help you digest fiber. Choose high-fiber snacks, such as berries, raw vegetables, nuts, and popcorn. What foods should I eat? Fruits Berries. Pears. Apples. Oranges. Avocado. Prunes and raisins. Dried figs. Vegetables Sweet potatoes. Spinach. Kale. Artichokes. Cabbage. Broccoli. Cauliflower.Green peas. Carrots. Squash. Grains Whole-grain breads. Multigrain cereal. Oats and oatmeal. Brown rice. Barley.Bulgur wheat. [place]. Quinoa. Bran muffins. Popcorn. Rye wafer crackers. Meats and other proteins Navy beans, kidney beans,  and pinto beans. Soybeans. Split peas. Lentils. Nutsand seeds. Dairy Fiber-fortified yogurt. Beverages Fiber-fortified soy milk. Fiber-fortified orange juice. Other  foods Fiber bars. The items listed above may not be a complete list of recommended foods and beverages. Contact a dietitian for more information. What foods should I avoid? Fruits Fruit juice. Cooked, strained fruit. Vegetables Fried potatoes. Canned vegetables. Well-cooked vegetables. Grains White bread. Pasta made with refined flour. White rice. Meats and other proteins Fatty cuts of meat. Fried chicken or fried fish. Dairy Milk. Yogurt. Cream cheese. Sour cream. Fats and oils Butters. Beverages Soft drinks. Other foods Cakes and pastries. The items listed above may not be a complete list of foods and beverages to avoid. Talk with your dietitian about what choices are best for you. Summary Fiber is a type of carbohydrate. It is found in foods such as fruits, vegetables, whole grains, and beans. A high-fiber diet has many benefits. It can help to prevent constipation, lower blood cholesterol, aid weight loss, and reduce your risk of heart disease, diabetes, and certain cancers. Increase your intake of fiber gradually. Increasing fiber too quickly may cause cramping, bloating, and gas. Drink plenty of water while you increase the amount of fiber you consume. The best sources of fiber include whole fruits and vegetables, whole grains, nuts, seeds, and beans. This information is not intended to replace advice given to you by your health care provider. Make sure you discuss any questions you have with your healthcare provider. Document Revised: 06/17/2019 Document Reviewed: 06/17/2019 Elsevier Patient Education  2022 [employer].

## 2020-08-10 NOTE — Progress Notes (Signed)
Chief Complaint  Patient presents with   other    Abdominal pain for two weeks pressure gas x helped some, bowel movement was hard a few times and was hard to come out.    About 3 weeks ago she started with abdominal pressure.  She thought it was just gas.  She had a bowel movement that morning, but didn't feel like she emptied. She took Gas-X and pain improved. Pain is gnawing, comes and goes.  She wondered if could be bladder infection, drank more water. Didn't have urgency, dysuria, urine wasn't cloudy.  She did have frequency, but also was drinking more water.  Urine was tea-colored.  Never saw blood in the urine.  Some stools are formed, sometimes loose, only small amounts.  This week-- Hard to start urine stream (noted a couple of times throughout the day), feels some pressure in lower abdomen. No burning or pain with urination. Feels bloated by 2pm. Bowels this week have been formed, and also loose, not a complete bowel movement.  No changes to the diet. Uses Lactaid milk, some juice and water.  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  Pregnancy test yesterday was negative. LMP 5/24 Sexually active. Some unprotected sex, she and partner have had STD tests in the past (negative).   PMH, PSH, SH reviewed  Outpatient Encounter Medications as of 08/10/2020  Medication Sig Note   ADDERALL XR 5 MG 24 hr capsule Take by mouth. 01/31/2020: Only when at school   albuterol (VENTOLIN HFA) 108 (90 Base) MCG/ACT inhaler INHALE 2 PUFF USING INHALER EVERY FOUR TO SIX HOURS AS NEEDED 08/10/2020: Uses prn, about 3x/week recently   cholecalciferol (VITAMIN D3) 25 MCG (1000 UNIT) tablet Take 1,000 Units by mouth daily.    fexofenadine (ALLEGRA) 180 MG tablet Take 180 mg by mouth daily.    fluticasone (FLONASE) 50 MCG/ACT nasal spray Place 2 sprays into  both nostrils daily.    fluticasone furoate-vilanterol (BREO ELLIPTA) 200-25 MCG/INH AEPB Inhale 1 puff into the lungs daily.    hydrocortisone 1 % ointment Apply 1 application topically 2 (two) times daily.    montelukast (SINGULAIR) 10 MG tablet Take 10 mg by mouth at bedtime.    Multiple Vitamins-Minerals (MULTIVITAMIN WITH MINERALS) tablet Take 1 tablet by mouth daily.    NON FORMULARY Take 1 capsule by mouth daily. 07/12/2019: Irish Sea Moss   NON FORMULARY Take 1 capsule by mouth daily. 07/12/2019: Black Seed Oil   valACYclovir (VALTREX) 500 MG tablet Take 1 tablet (500 mg total) by mouth daily. Prophylaxis (Patient taking differently: Take 500 mg by mouth 2 (two) times daily as needed (prn for  flares). Take 1 tablet twice daily for 5 days with flares)    [DISCONTINUED] albuterol (PROVENTIL HFA;VENTOLIN HFA) 108 (90 BASE) MCG/ACT inhaler 2 puffs q 4 hrs-(PT NEEDS PROVENTIL NO VENTOLIN) 08/10/2020: Uses prn, about 3x/week recently   [DISCONTINUED] fluticasone furoate-vilanterol (BREO ELLIPTA) 200-25 MCG/INH AEPB INHALE 1 PUFF ONCE A DAY RINSE MOUTH AFTER EACH USE (Patient not taking: Reported on 08/10/2020)    No facility-administered encounter medications on file as of 08/10/2020.   No Known Allergies  ROS: no fever, chills, URI symptoms, headaches, dizziness, nausea, vomiting. Abdominal pain and bowel change per HPI. No dysuria, hematuria. See HPI Allergies and asthma are fairly well controlled with current meds per pt.  PHYSICAL EXAM:  BP (!) 136/98   Pulse 93   Temp 98.6 F (37 C)   Wt 214 lb (97.1 kg)   LMP 07/27/2020   BMI 35.61 kg/m   Pleasant female, slightly anxious, in no distress HEENT conjunctiva and sclera are clear, EOMI, wearing mask Heart: regular rate and rhythm, no murmur Lungs: clear bilaterally, no wheezing, rales, ronchi Abdomen: normal bowel sounds.  Nondistended, soft.  Tender in suprapubic area and LLQ. No rebound tenderness or mass  Urine: SG 1.025,  otherwise normal  ASSESSMENT/PLAN:  Generalized abdominal pain - suspect constipation is a component. High fiber diet, increase water intake, exercise. Metamucil vs miralax discussed.  - Plan: POCT URINALYSIS DIP (CLINITEK)  Pelvic pain - reassurred no e/o UTI. Will need pelvic exam if persistent/worsening symptoms - Plan: GC/Chlamydia Probe Amp  Unprotected sex - has had negative STD check in past, but will recheck for GC/chlamydia given sx and provided urine sample - Plan: GC/Chlamydia Probe Amp   I spent 35 minutes dedicated to the care of this patient, including pre-visit review of records, face to face time, post-visit ordering of testing and documentation.

## 2020-08-11 NOTE — Telephone Encounter (Signed)
Patient informed. 

## 2020-08-13 LAB — SPECIMEN STATUS REPORT

## 2020-08-13 LAB — GC/CHLAMYDIA PROBE AMP
Chlamydia trachomatis, NAA: NEGATIVE
Neisseria Gonorrhoeae by PCR: NEGATIVE

## 2020-09-14 ENCOUNTER — Other Ambulatory Visit (HOSPITAL_COMMUNITY): Payer: Self-pay

## 2020-10-10 ENCOUNTER — Telehealth: Payer: Self-pay

## 2020-10-10 NOTE — Telephone Encounter (Signed)
Patient called requesting Rx for Metronidazole to treat BV infection.   She has been diagnosed with it previous times and said she is familiar with sx. She has been using the Boric Acid capsules that you recommended and even using a probiotic. She c/o tan/greyish vaginal discharge on pad and vaginal odor.

## 2020-10-10 NOTE — Telephone Encounter (Deleted)
Patient called requesting Rx for Metronidazole to treat BV infection.  She has been diagnosed with it previous times and said she is familiar with sx. She has been using the

## 2020-10-18 ENCOUNTER — Telehealth: Payer: Self-pay | Admitting: *Deleted

## 2020-10-18 NOTE — Telephone Encounter (Signed)
Patient called to report her cycle started 10 days earlier than normal. Patient said the flow is not heavy. I recommended she keep an menstrual calender and if continues to follow up if needed.

## 2020-10-20 MED ORDER — METRONIDAZOLE 500 MG PO TABS: 500.0000 mg | ORAL_TABLET | Freq: Two times a day (BID) | ORAL | 0 refills | Status: DC

## 2020-10-20 NOTE — Telephone Encounter (Signed)
Agree with Metronidazole treatment for probable BV.

## 2020-10-20 NOTE — Telephone Encounter (Signed)
Patient informed. Rx sent 

## 2020-11-01 ENCOUNTER — Other Ambulatory Visit: Payer: Self-pay

## 2020-11-01 ENCOUNTER — Encounter: Payer: Self-pay | Admitting: Obstetrics & Gynecology

## 2020-11-01 ENCOUNTER — Ambulatory Visit (INDEPENDENT_AMBULATORY_CARE_PROVIDER_SITE_OTHER): Admitting: Obstetrics & Gynecology

## 2020-11-01 VITALS — BP 106/72 | HR 94 | Resp 16

## 2020-11-01 DIAGNOSIS — N898 Other specified noninflammatory disorders of vagina: Secondary | ICD-10-CM | POA: Diagnosis not present

## 2020-11-01 LAB — WET PREP FOR TRICH, YEAST, CLUE

## 2020-11-01 MED ORDER — FLUCONAZOLE 150 MG PO TABS: 150.0000 mg | ORAL_TABLET | Freq: Every day | ORAL | 2 refills | Status: AC

## 2020-11-01 NOTE — Progress Notes (Signed)
    Tonya Perkins [birth year] [id number]        40 y.o.  G0 Stable boyfriend  RP: Vaginal itching x yesterday  HPI: Vaginal itching x yesterday.  No odor.  No pelvic pain.  Menses regular normal every month.  Occasionally having mittle schmertz.  Natural contraception.  Declines STI screen.  Going on a cruise to the [place] today.   OB History  Gravida Para Term Preterm AB Living  0 0 0 0 0 0  SAB IAB Ectopic Multiple Live Births  0 0 0 0 0    Past medical history,surgical history, problem list, medications, allergies, family history and social history were all reviewed and documented in the EPIC chart.   Directed ROS with pertinent positives and negatives documented in the history of present illness/assessment and plan.  Exam:  Vitals:   11/01/20 0941  BP: 106/72  Pulse: 94  Resp: 16   General appearance:  Normal  Abdomen: Normal  Gynecologic exam: Vulva normal.  Speculum:  Mild increase in vaginal discharge.  Wet prep done.  Wet prep:  Yeasts present with budding.   Assessment/Plan:  40 y.o. G0P0000   1. Vaginal itching Yeast vaginitis confirmed by wet prep.  Decision to treat with fluconazole.  No contraindication.  Usage reviewed and prescription sent to pharmacy. - WET PREP FOR [place], YEAST, CLUE  Other orders - Azelastine-Fluticasone 137-50 MCG/ACT SUSP; Place 1 spray into both nostrils 2 (two) times daily. - levocetirizine (XYZAL) 5 MG tablet; Take 5 mg by mouth daily. - Olopatadine HCl 0.2 % SOLN; SMARTSIG:1 Drop(s) In Eye(s) Daily PRN - fluconazole (DIFLUCAN) 150 MG tablet; Take 1 tablet (150 mg total) by mouth daily for 3 days.   [name] MD, 9:47 AM 11/01/2020

## 2020-11-30 ENCOUNTER — Telehealth: Payer: Self-pay | Admitting: *Deleted

## 2020-11-30 MED ORDER — FLUCONAZOLE 150 MG PO TABS: 150.0000 mg | ORAL_TABLET | Freq: Every day | ORAL | 0 refills | Status: DC

## 2020-11-30 NOTE — Telephone Encounter (Signed)
Patient called stating [facility] does not have her refill Diflucan 150 mg # 3 that was sent on 11/01/20. Dr.Lavoie sent in 2 additional refills diflucan 150 mg, patient say she picked up the 1st Rx and picked up the 1 refill, but has 1 left. I called [facility] to confirm Rx was received and was told they never received on 11/01/20, which was very strange because patient said she picked up Rx from this location. I told patient I would send in diflucan 150 mg # 3 tablet which would be the last refill.

## 2020-12-06 ENCOUNTER — Other Ambulatory Visit (HOSPITAL_COMMUNITY): Payer: Self-pay

## 2021-01-15 ENCOUNTER — Ambulatory Visit: Admitting: Obstetrics & Gynecology

## 2021-01-17 ENCOUNTER — Other Ambulatory Visit (HOSPITAL_COMMUNITY): Payer: Self-pay

## 2021-01-24 ENCOUNTER — Ambulatory Visit: Admitting: Obstetrics & Gynecology

## 2021-01-24 DIAGNOSIS — Z0289 Encounter for other administrative examinations: Secondary | ICD-10-CM

## 2021-02-13 ENCOUNTER — Other Ambulatory Visit: Payer: Self-pay | Admitting: Nurse Practitioner

## 2021-02-13 DIAGNOSIS — N760 Acute vaginitis: Secondary | ICD-10-CM

## 2021-02-13 MED ORDER — HYDROCORTISONE 1 % EX OINT: 1.0000 "application " | TOPICAL_OINTMENT | Freq: Two times a day (BID) | CUTANEOUS | 0 refills | Status: DC

## 2021-02-13 MED ORDER — VALACYCLOVIR HCL 500 MG PO TABS: ORAL_TABLET | ORAL | 0 refills | Status: DC

## 2021-02-13 NOTE — Telephone Encounter (Signed)
Patient mentioned she also needs refill on Vlatrex. Rx sent and signed with [facility] name annual exam due in April 2023

## 2021-03-23 ENCOUNTER — Other Ambulatory Visit: Payer: Self-pay | Admitting: Obstetrics & Gynecology

## 2021-03-23 MED ORDER — FLUCONAZOLE 150 MG PO TABS: 150.0000 mg | ORAL_TABLET | Freq: Every day | ORAL | 0 refills | Status: DC

## 2021-03-23 NOTE — Telephone Encounter (Signed)
Patient reports she started probiotic vaginal tablet and now has yeast infection.

## 2021-03-27 ENCOUNTER — Ambulatory Visit: Admitting: Obstetrics & Gynecology

## 2021-03-27 ENCOUNTER — Ambulatory Visit (INDEPENDENT_AMBULATORY_CARE_PROVIDER_SITE_OTHER): Admitting: Nurse Practitioner

## 2021-03-27 ENCOUNTER — Other Ambulatory Visit: Payer: Self-pay

## 2021-03-27 VITALS — BP 116/78

## 2021-03-27 DIAGNOSIS — B9689 Other specified bacterial agents as the cause of diseases classified elsewhere: Secondary | ICD-10-CM | POA: Diagnosis not present

## 2021-03-27 DIAGNOSIS — N898 Other specified noninflammatory disorders of vagina: Secondary | ICD-10-CM | POA: Diagnosis not present

## 2021-03-27 DIAGNOSIS — N760 Acute vaginitis: Secondary | ICD-10-CM | POA: Diagnosis not present

## 2021-03-27 LAB — WET PREP FOR TRICH, YEAST, CLUE

## 2021-03-27 MED ORDER — METRONIDAZOLE 500 MG PO TABS: 500.0000 mg | ORAL_TABLET | Freq: Two times a day (BID) | ORAL | 0 refills | Status: DC

## 2021-03-27 NOTE — Progress Notes (Signed)
° °  Acute Office Visit  Subjective:    Patient ID: Tonya Perkins, female    DOB: [birth year], 41 y.o.   MRN: [id number]   HPI 41 y.o. presents today for vaginal itching, discharge, and odor. Took Diflucan x 2 with some improvement but symptoms persist. Not currently sexually active.    Review of Systems  Constitutional: Negative.   Genitourinary:  Positive for vaginal discharge.       Vaginal itching, odor      Objective:    Physical Exam Constitutional:      Appearance: Normal appearance.  Genitourinary:    General: Normal vulva.     Vagina: Vaginal discharge present. No erythema.     Cervix: Normal.    BP 116/78    LMP 03/09/2021  Wt Readings from Last 3 Encounters:  08/10/20 214 lb (97.1 kg)  06/06/20 213 lb (96.6 kg)  01/31/20 216 lb (98 kg)   Wet prep + clue cells     Assessment & Plan:   Problem List Items Addressed This Visit   None Visit Diagnoses     Bacterial vaginosis    -  Primary   Relevant Medications   metroNIDAZOLE (FLAGYL) 500 MG tablet   Vaginal discharge       Relevant Orders   WET PREP FOR [place], YEAST, CLUE (Completed)      Plan: Wet prep positive for clue cells - Flagyl 500 mg BID x 7 days. Will return if symptoms worsen or do not improve.      [place], 6:36 PM 03/27/2021

## 2021-04-30 ENCOUNTER — Other Ambulatory Visit: Payer: Self-pay | Admitting: Nurse Practitioner

## 2021-04-30 DIAGNOSIS — B9689 Other specified bacterial agents as the cause of diseases classified elsewhere: Secondary | ICD-10-CM

## 2021-04-30 NOTE — Telephone Encounter (Signed)
Refill request received for flagyl. ? ?Call to patient. 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 Symptoms of yellow vaginal d/c and itching around vulva started 2 days ago. Started OTC boric acid vaginal suppositories 2 days ago, reports some improvement of itching, provided very little relief. Recommended OV for further evaluation, patient agreeable. OV scheduled for 05/01/21 at 0900, arrive 0845 with Dr. Dellis Filbert. Advised patient I will forward to Dr. Dellis Filbert to review, our office will return call if any additional recommendations. Patient agreeable.  ? ?Rx refused.  ? ?Routing to provider for final review. Patient is agreeable to disposition. Will close encounter. ? ? ? ? ?

## 2021-05-01 ENCOUNTER — Encounter: Payer: Self-pay | Admitting: Obstetrics & Gynecology

## 2021-05-01 ENCOUNTER — Ambulatory Visit (INDEPENDENT_AMBULATORY_CARE_PROVIDER_SITE_OTHER): Admitting: Obstetrics & Gynecology

## 2021-05-01 ENCOUNTER — Other Ambulatory Visit: Payer: Self-pay

## 2021-05-01 VITALS — BP 110/80

## 2021-05-01 DIAGNOSIS — N898 Other specified noninflammatory disorders of vagina: Secondary | ICD-10-CM | POA: Diagnosis not present

## 2021-05-01 LAB — WET PREP FOR TRICH, YEAST, CLUE

## 2021-05-01 MED ORDER — FLUCONAZOLE 150 MG PO TABS: 150.0000 mg | ORAL_TABLET | Freq: Every day | ORAL | 3 refills | Status: AC

## 2021-05-01 NOTE — Progress Notes (Signed)
? ? ?  Tonya Perkins 02/06/1981 [id number] ? ? ?     41 y.o.  G0 ? ?RP: Vaginal discharge with itching ? ?HPI: Yellow vaginal discharge and itching. No vaginal odor.  No pelvic pain.  No fever.  Declines STI screen. ? ? ?OB History  ?Gravida Para Term Preterm AB Living  ?0 0 0 0 0 0  ?SAB IAB Ectopic Multiple Live Births  ?0 0 0 0 0  ? ? ?Past medical history,surgical history, problem list, medications, allergies, family history and social history were all reviewed and documented in the EPIC chart. ? ? ?Directed ROS with pertinent positives and negatives documented in the history of present illness/assessment and plan. ? ?Exam: ? ?Vitals:  ? 05/01/21 0905  ?BP: 110/80  ? ?General appearance:  Normal ? ?Abdomen: Normal ? ?Gynecologic exam: Vulva normal.  Speculum:  Cervix/Vagina normal.  Increased cottage-cheese like discharge.  Wet prep done. ? ?Wet prep:  Yeasts present with budding and hyphae. ? ? ?Assessment/Plan:  41 y.o. G0 ? ?1. Vaginal discharge ?Yellow vaginal discharge and itching. No vaginal odor.  No pelvic pain.  No fever.  Declines STI screen.  Wet prep confirming a severe yeast vaginitis.  Will treat with Fluconazole 150 mg 1 tab PO daily x 3.  Usage reviewed and prescription sent to pharmacy.  Boric Acid and yogurt for prevention as needed. ?- WET PREP FOR [place], YEAST, CLUE ? ?2. Vaginal itching ?- WET PREP FOR [place], YEAST, CLUE ? ?Other orders ?- UNABLE TO FIND; Med Name: sea moss pill po ?- fluconazole (DIFLUCAN) 150 MG tablet; Take 1 tablet (150 mg total) by mouth daily for 3 days.  ? ?[name] MD, 9:24 AM 05/01/2021 ? ? ? ?  ?

## 2021-05-04 ENCOUNTER — Encounter: Payer: Self-pay | Admitting: Obstetrics & Gynecology

## 2021-05-22 ENCOUNTER — Other Ambulatory Visit: Payer: Self-pay

## 2021-05-22 DIAGNOSIS — N926 Irregular menstruation, unspecified: Secondary | ICD-10-CM

## 2021-05-22 NOTE — Telephone Encounter (Signed)
FYI. Pt scheduled for 06/14/21.  ?

## 2021-06-14 ENCOUNTER — Ambulatory Visit (INDEPENDENT_AMBULATORY_CARE_PROVIDER_SITE_OTHER): Admitting: Obstetrics & Gynecology

## 2021-06-14 ENCOUNTER — Ambulatory Visit (INDEPENDENT_AMBULATORY_CARE_PROVIDER_SITE_OTHER)

## 2021-06-14 ENCOUNTER — Telehealth: Payer: Self-pay | Admitting: *Deleted

## 2021-06-14 ENCOUNTER — Encounter: Payer: Self-pay | Admitting: Obstetrics & Gynecology

## 2021-06-14 VITALS — BP 118/76

## 2021-06-14 DIAGNOSIS — D250 Submucous leiomyoma of uterus: Secondary | ICD-10-CM | POA: Diagnosis not present

## 2021-06-14 DIAGNOSIS — N926 Irregular menstruation, unspecified: Secondary | ICD-10-CM | POA: Diagnosis not present

## 2021-06-14 DIAGNOSIS — Z113 Encounter for screening for infections with a predominantly sexual mode of transmission: Secondary | ICD-10-CM

## 2021-06-14 NOTE — Progress Notes (Signed)
? ? ?  Tonya Perkins [birth year] [id number] ? ? ?     41 y.o.  G0 ? ?RP: Irregular menses for Pelvic US ? ?HPI: Irregular menses.  No pelvic pain.  Partner cheated, requesting STI screen.  No abnormal vaginal discharge.  No fever. ? ? ?OB History  ?Gravida Para Term Preterm AB Living  ?0 0 0 0 0 0  ?SAB IAB Ectopic Multiple Live Births  ?0 0 0 0 0  ? ? ?Past medical history,surgical history, problem list, medications, allergies, family history and social history were all reviewed and documented in the EPIC chart. ? ? ?Directed ROS with pertinent positives and negatives documented in the history of present illness/assessment and plan. ? ?Exam: ? ?Vitals:  ? 06/14/21 1056  ?BP: 118/76  ? ?General appearance:  Normal ? ?Pelvic US today: T/V images.  Anteverted uterus normal in size with multiple small intramural fibroids.  The overall uterine size is measured at 9.35 x 4.66 x 4.7 cm.  The endometrial lining is Tri-layered symmetrical measured at 6.2 mm.  A submucosal fibroid is noted measured at 2.8 x 1.9 cm.  Both ovaries are slightly enlarged with positive perfusion bilaterally.  The right ovary is normal in appearance with a follicular pattern.  The left ovary has a normal follicular pattern with a resolving avascular cyst measured at 1.9 x 2 cm.  Trace free fluid in the pelvis. ? ?Gyn exam: Vulva normal.  Speculum:  Cervix normal.  Gono-Chlam done.  Vagina normal.  Normal secretions. ? ? ?Assessment/Plan:  41 y.o. G0 ? ?1. Irregular bleeding ?Irregular menses.  No pelvic pain.  Partner cheated, requesting STI screen.  No abnormal vaginal discharge.  No fever.  Pelvic ultrasound findings thoroughly reviewed.  Patient informed of the presence of a completely submucosal fibroid measured at 2.8 x 1.9 cm.  The submucosal fibroid is probably associated with the irregular bleeding that the patient is experiencing.  We will proceed with excision of the fibroid. ? ?2. Submucous uterine fibroid ?Irregular vaginal bleeding  associated with a submucosal fibroid measured at 2.8 x 1.9 cm.  Decision to proceed with excision.  Schedule HSC/Myosure XL Excision/D+C.  Surgery reviewed and pamphlet given.  Follow-up preop visit. ? ?3. Screen for STD (sexually transmitted disease) ?Strict condom use strongly recommended. ?- C. trachomatis/N. gonorrhoeae RNA ?- HIV antibody (with reflex) ?- RPR ?- Hepatitis B Surface AntiGEN ?- Hepatitis C Antibody  ? ?[name] MD, 11:23 AM 06/14/2021 ? ? ? ?  ?

## 2021-06-14 NOTE — Telephone Encounter (Signed)
-----   Message from [name], MD sent at 06/14/2021 11:27 AM EDT ----- ?Regarding: Schedule surgery ?Surgery: CPT [phone] - Hysteroscopy/D&C/Myosure ? ?Diagnosis: D25.9 Submucosal Fibroid, N93.9 Abnormal Uterine Bleeding ? ?Location: [facility] ? ?Status: Outpatient ? ?Time: 60 Minutes ? ?Assistant: N/A ? ?Urgency: First Available ? ?Pre-Op Appointment: To Be Scheduled ? ?Post-Op Appointment(s): 2 Weeks ? ?Time Out Of Work: Day Of Surgery, 1 Day Post Op  ? ?

## 2021-06-15 LAB — C. TRACHOMATIS/N. GONORRHOEAE RNA
C. trachomatis RNA, TMA: NOT DETECTED
N. gonorrhoeae RNA, TMA: NOT DETECTED

## 2021-06-15 LAB — RPR: RPR Ser Ql: NONREACTIVE

## 2021-06-15 LAB — HEPATITIS C ANTIBODY
Hepatitis C Ab: NONREACTIVE
SIGNAL TO CUT-OFF: 0.11 (ref <1.00)

## 2021-06-15 LAB — HEPATITIS B SURFACE ANTIGEN: Hepatitis B Surface Ag: NONREACTIVE

## 2021-06-15 LAB — HIV ANTIBODY (ROUTINE TESTING W REFLEX): HIV 1&2 Ab, 4th Generation: NONREACTIVE

## 2021-06-18 ENCOUNTER — Encounter: Payer: Self-pay | Admitting: Obstetrics & Gynecology

## 2021-06-20 NOTE — Telephone Encounter (Signed)
Left message to return call to [facility] at [phone], Prompt 3.  ?

## 2021-06-25 NOTE — Telephone Encounter (Signed)
Spoke with patient regarding surgery benefits. Patient acknowledges understanding of information presented. Patient is aware that benefits presented are professional benefits only. Patient is aware the hospital will call with facility benefits. See account note.  Routing to Jill Hamm, RN.  

## 2021-06-26 NOTE — Progress Notes (Signed)
?Chief Complaint  ?Patient presents with  ? Annual Exam  ?  Fasting annual exam, no pap-had last month with Dr. Dellis Filbert. Had Tdap and has paperwork at home, she will get to [place]. She is not sure if she has had pneumo, she will check with her allergist. Had flu shot, no covid boosters. No new concerns.   ? ? ?Tonya Perkins is a 41 y.o. female who presents for a complete physical.  ? ?She is under the care of Dr. Dellis Filbert, and is planning for D&C/hysteroscopy with myosure on 07/31/21 for submucosal uterine fibroid (causing abnormal bleeding). ?She has been treated for yeast (10/2020, 04/2021) and BV (02/2021).  She has had negative STI screens through her GYN. ? ?She is under the care of allergist for allergies and asthma.  She is using Breo, singulair, and albuterol prn for asthma. She uses it 3x/week, just once a day, while at work if very active. ?She is on Xyzal BID.  Admits to not using Astelin (thinks it made it worse, nose more runny), and same with Flonase.  She recently restarted immunotherapy (last month). Allergies seem to be okay currently. ? ?She had quit smoking, but restarted 12/2018 when stress began related to her husband's mental health.  1-2 cigars (black and mild).  She quit smoking again 03/2020. ? ?GERD-- Only occasional, related to her diet (spaghetti sauce or greasy foods) . Rarely takes a Tums. ? ?H/o ADD.  She previously used adderall when in school.  She is back in school now for RN. She has so far been able to focus well (turning off TV, keeping it quiet).  Grades have been okay.  She is requesting a refill to have on hand, for exams, clinicals. ? ?Elevated BP's--she monitors at home, didn't bring list. BP's 142/98, 132/101, 128/77 per recall. ?Sometimes gets headaches. ?She notices it higher when not getting enough sleep, eating more salt.  +chips. ?Denies chest pain, palpitations, edema. ? ?Vitamin D deficiency was noted 06/2019, with level of 11.2.  She is currently taking 5000 IU daily (most  days). ? ?Immunization History  ?Administered Date(s) Administered  ? DTaP 02/05/1982, 04/10/1982, 08/16/1982, 03/03/1987  ? Hepatitis B 11/28/1993, 01/02/1994, 06/12/1994, 08/11/2014  ? IPV 02/05/1982, 04/10/1982, 03/03/1987  ? Influenza Split 02/07/2011, 12/27/2011  ? Influenza,inj,Quad PF,6+ Mos 03/28/2014, 01/31/2020  ? Influenza-Unspecified 05/10/2019, 11/25/2020  ? MMR 04/05/1983, 09/07/2014  ? PFIZER(Purple Top)SARS-COV-2 Vaccination 10/26/2019, 01/31/2020  ? Pneumococcal Polysaccharide-23 08/15/2010  ? Tdap 04/08/2006  ? ?She had TdaP in 04/2019 through employee health at [facility]. She will get [place] the dates. ?Last Pap smear: 05/2020 normal (Dr. Dellis Filbert) ?Last mammogram: never ?Last colonoscopy: never ?Last DEXA: never ?Dentist: 2x/year ?Ophtho: 2 years ago. She uses blue light glasses, which reduced her headaches. ?Exercise: Walks dog 3x/week on a trail, 1.5 miles. ?Uses 5# dumbbells and does yoga 2x/week. ? ?Lipid screen: ?Lab Results  ?Component Value Date  ? CHOL 154 07/12/2019  ? HDL 72 07/12/2019  ? [place] 72 07/12/2019  ? TRIG 45 07/12/2019  ? CHOLHDL 2.1 07/12/2019  ? ? ?  06/27/2021  ? 10:15 AM 07/12/2019  ?  9:38 AM 03/09/2012  ?  1:40 PM  ?Depression screen PHQ 2/9  ?Decreased Interest 1 0 0  ?Down, Depressed, Hopeless 1 0 0  ?PHQ - 2 Score 2 0 0  ?Altered sleeping 0    ?Tired, decreased energy 1    ?Change in appetite 0    ?Feeling bad or failure about yourself  1    ?  Trouble concentrating 1    ?Moving slowly or fidgety/restless 0    ?Suicidal thoughts 0    ?PHQ-9 Score 5    ?Difficult doing work/chores Somewhat difficult    ? 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 ?Feeling better now, found a more reliable job. ?Plans to start at [employer] soon. ? ? ?PMH, PSH, SH and FH were reviewed and updated ? ?Outpatient Encounter Medications as of 06/27/2021  ?Medication Sig Note  ? albuterol (VENTOLIN HFA) 108 (90 Base) MCG/ACT inhaler INHALE 2 PUFF  USING INHALER EVERY FOUR TO SIX HOURS AS NEEDED 08/10/2020: Uses prn, about 3x/week recently  ? cholecalciferol (VITAMIN D3) 25 MCG (1000 UNIT) tablet Take 1,000 Units by mouth daily. 06/27/2021: Pt states she is taking 5000 IU daily  ? fluticasone furoate-vilanterol (BREO ELLIPTA) 200-25 MCG/INH AEPB Inhale 1 puff into the lungs daily.   ? levocetirizine (XYZAL) 5 MG tablet Take 5 mg by mouth daily. 06/27/2021: Using BID per allergist's instructions  ? montelukast (SINGULAIR) 10 MG tablet Take 10 mg by mouth at bedtime.   ? Multiple Vitamins-Minerals (MULTIVITAMIN WITH MINERALS) tablet Take 1 tablet by mouth daily.   ? Olopatadine HCl 0.2 % SOLN SMARTSIG:1 Drop(s) In Eye(s) Daily PRN   ? ADDERALL XR 5 MG 24 hr capsule Take by mouth. (Patient not taking: Reported on 06/27/2021) 01/31/2020: Only when at school  ? Azelastine-Fluticasone 137-50 MCG/ACT SUSP Place 1 spray into both nostrils 2 (two) times daily. (Patient not taking: Reported on 06/27/2021)   ? hydrocortisone 1 % ointment Apply 1 application topically 2 (two) times daily. (Patient not taking: Reported on 06/27/2021)   ? UNABLE TO FIND Med Name: sea moss pill po (Patient not taking: Reported on 06/27/2021) 06/27/2021: Doesn't take daily.  "Holistic med to help with blood pressure, cholesterol"  ? valACYclovir (VALTREX) 500 MG tablet Take 1 tablet twice daily for 5 days for outbreak (Patient not taking: Reported on 06/27/2021)   ? ?No facility-administered encounter medications on file as of 06/27/2021.  ? ? ? ?No Known Allergies ? ? ?ROS:  The patient denies anorexia, fever, weight changes, vision changes, decreased hearing, ear pain, sore throat, breast concerns, chest pain, palpitations, dizziness, syncope, dyspnea on exertion, cough, swelling, nausea, vomiting, diarrhea, constipation, abdominal pain, melena, hematochezia, indigestion/heartburn, hematuria, incontinence, dysuria, vaginal discharge, odor or itch, genital lesions, joint pains, numbness, tingling, weakness,  tremor, suspicious skin lesions, depression, anxiety, abnormal bleeding/bruising, or enlarged lymph nodes. ?Infrequent heartburn.   ?Allergies are doing pretty well currently. Needing albuterol 3x/week when working. ?Irregular vaginal bleeding--was getting cycle twice a month. ?Denies any vaginal discharge, itching. ?Some HA's, usually when BP high ?Moods were worse just recently, improving. See above. ? ? ? ?PHYSICAL EXAM: ? ?BP (!) 150/110   Pulse 80   Temp 97.8 ?F (36.6 ?C) (Tympanic)   Ht _0  (1.651 m)   Wt 202 lb (91.6 kg)   LMP 06/25/2021   BMI 33.61 kg/m?  ? ?132/100 on repeat by MD ? ?Wt Readings from Last 3 Encounters:  ?06/27/21 202 lb (91.6 kg)  ?08/10/20 214 lb (97.1 kg)  ?06/06/20 213 lb (96.6 kg)  ? ? ?General Appearance:    Alert, cooperative, no distress, appears stated age  ?Head:    Normocephalic, without obvious abnormality, atraumatic  ?Eyes:    PERRL, conjunctiva/corneas clear, EOM's intact, fundi benign  ?Ears:    Normal TM's and external ear canals  ?Nose:   Normal,  no drainage  ?Throat:   Normal, no lesions  ?Neck:   Supple, no lymphadenopathy;  thyroid:  no enlargement/ tenderness/nodules; no carotid  bruit or JVD  ?Back:    Spine nontender, no curvature, ROM normal, no CVA     tenderness  ?Lungs:     Clear to auscultation bilaterally without wheezes, rales or     ronchi; respirations unlabored  ?Chest Wall:    No tenderness or deformity  ? Heart:    Regular rate and rhythm, S1 and S2 normal, no murmur, rub ?  or gallop  ?Breast Exam:    Deferred to GYN  ?Abdomen:     Soft, non-tender, nondistended, normoactive bowel sounds,  ?  no masses, no hepatosplenomegaly  ?Genitalia:    Deferred to GYN  ?   ?Extremities:   No clubbing, cyanosis or edema  ?Pulses:   2+ and symmetric all extremities  ?Skin:   Skin color, texture, turgor normal, no rashes or lesions.  Limited exam, patient elected not to change into gown.   ?Lymph nodes:   Cervical, supraclavicular nodes normal  ?Neurologic:    Normal strength, sensation and gait; reflexes 2+ and symmetric throughout  ?        Psych:   Normal mood, affect, hygiene and grooming.   ? ? ? ?ASSESSMENT/PLAN: ? ?Annual physical exam - Plan: POCT Urinalysis DIP

## 2021-06-26 NOTE — Telephone Encounter (Signed)
Spoke with patient. Reviewed surgery dates. Patient request to proceed with surgery on 07/31/21.  Patient declined earlier surgery date. I will return call once surgery date and time confirmed. Patient verbalizes understanding and is agreeable.  ? ?Surgery request sent.  ? ?

## 2021-06-26 NOTE — Patient Instructions (Addendum)
?  HEALTH MAINTENANCE RECOMMENDATIONS: ? ?It is recommended that you get at least 30 minutes of aerobic exercise at least 5 days/week (for weight loss, you may need as much as 60-90 minutes). This can be any activity that gets your heart rate up. This can be divided in 10-15 minute intervals if needed, but try and build up your endurance at least once a week.  Weight bearing exercise is also recommended twice weekly. ? ?Eat a healthy diet with lots of vegetables, fruits and fiber.  "Colorful" foods have a lot of vitamins (ie green vegetables, tomatoes, red peppers, etc).  Limit sweet tea, regular sodas and alcoholic beverages, all of which has a lot of calories and sugar.  Up to 1 alcoholic drink daily may be beneficial for women (unless trying to lose weight, watch sugars).  Drink a lot of water. ? ?Calcium recommendations are 1200-1500 mg daily (1500 mg for postmenopausal women or women without ovaries), and vitamin D 1000 IU daily.  This should be obtained from diet and/or supplements (vitamins), and calcium should not be taken all at once, but in divided doses. ? ?Monthly self breast exams and yearly mammograms for women over the age of 25 is recommended. ? ?Sunscreen of at least SPF 30 should be used on all sun-exposed parts of the skin when outside between the hours of 10 am and 4 pm (not just when at beach or pool, but even with exercise, golf, tennis, and yard work!)  Use a sunscreen that says "broad spectrum" so it covers both UVA and UVB rays, and make sure to reapply every 1-2 hours. ? ?Remember to change the batteries in your smoke detectors when changing your clock times in the spring and fall. Carbon monoxide detectors are recommended for your home. ? ?Use your seat belt every time you are in a car, and please drive safely and not be distracted with cell phones and texting while driving. ? ?We gave you a pneumovax booster today. ?I encourage you to get a COVID booster (you need to wait 2 weeks from  today's vaccine). ? ?Your blood pressure was high today. ?BP goal is <130/80. ?Please monitor regularly, record on log sheet, and bring to your visit in 6 weeks. ?Please cut back on the sodium intake and exercise regularly. ?

## 2021-06-27 ENCOUNTER — Ambulatory Visit (INDEPENDENT_AMBULATORY_CARE_PROVIDER_SITE_OTHER): Payer: No Typology Code available for payment source | Admitting: Family Medicine

## 2021-06-27 ENCOUNTER — Encounter: Payer: Self-pay | Admitting: Family Medicine

## 2021-06-27 VITALS — BP 132/100 | HR 80 | Temp 97.8°F | Ht 65.0 in | Wt 202.0 lb

## 2021-06-27 DIAGNOSIS — Z23 Encounter for immunization: Secondary | ICD-10-CM | POA: Diagnosis not present

## 2021-06-27 DIAGNOSIS — E559 Vitamin D deficiency, unspecified: Secondary | ICD-10-CM | POA: Diagnosis not present

## 2021-06-27 DIAGNOSIS — F988 Other specified behavioral and emotional disorders with onset usually occurring in childhood and adolescence: Secondary | ICD-10-CM

## 2021-06-27 DIAGNOSIS — N926 Irregular menstruation, unspecified: Secondary | ICD-10-CM

## 2021-06-27 DIAGNOSIS — Z0000 Encounter for general adult medical examination without abnormal findings: Secondary | ICD-10-CM

## 2021-06-27 DIAGNOSIS — J4540 Moderate persistent asthma, uncomplicated: Secondary | ICD-10-CM

## 2021-06-27 DIAGNOSIS — J302 Other seasonal allergic rhinitis: Secondary | ICD-10-CM

## 2021-06-27 DIAGNOSIS — Z6833 Body mass index (BMI) 33.0-33.9, adult: Secondary | ICD-10-CM | POA: Diagnosis not present

## 2021-06-27 DIAGNOSIS — R030 Elevated blood-pressure reading, without diagnosis of hypertension: Secondary | ICD-10-CM

## 2021-06-27 LAB — POCT URINALYSIS DIP (PROADVANTAGE DEVICE)
Bilirubin, UA: NEGATIVE
Glucose, UA: NEGATIVE mg/dL
Ketones, POC UA: NEGATIVE mg/dL
Leukocytes, UA: NEGATIVE
Nitrite, UA: NEGATIVE
Protein Ur, POC: NEGATIVE mg/dL
Specific Gravity, Urine: 1.01
Urobilinogen, Ur: NEGATIVE
pH, UA: 6.5 (ref 5.0–8.0)

## 2021-06-27 MED ORDER — ADDERALL XR 5 MG PO CP24: 5.0000 mg | ORAL_CAPSULE | Freq: Every day | ORAL | 0 refills | Status: DC

## 2021-06-27 NOTE — Telephone Encounter (Signed)
Spoke with patient. Surgery date request confirmed.  ?Advised surgery is scheduled for 07/31/21, [facility] at 1030. ?Surgery instruction sheet and hospital brochure reviewed, printed copy will be mailed.  ?Patient verbalizes understanding and is agreeable.  ?Routing to provider. Encounter closed.  ? ?Cc: Hayley Carder ? ? ?

## 2021-06-28 LAB — CBC WITH DIFFERENTIAL/PLATELET
Basophils Absolute: 0.1 10*3/uL (ref 0.0–0.2)
Basos: 1 %
EOS (ABSOLUTE): 0.2 10*3/uL (ref 0.0–0.4)
Eos: 2 %
Hematocrit: 41.2 % (ref 34.0–46.6)
Hemoglobin: 14.1 g/dL (ref 11.1–15.9)
Immature Grans (Abs): 0 10*3/uL (ref 0.0–0.1)
Immature Granulocytes: 0 %
Lymphocytes Absolute: 2.4 10*3/uL (ref 0.7–3.1)
Lymphs: 33 %
MCH: 33.3 pg — ABNORMAL HIGH (ref 26.6–33.0)
MCHC: 34.2 g/dL (ref 31.5–35.7)
MCV: 97 fL (ref 79–97)
Monocytes Absolute: 0.6 10*3/uL (ref 0.1–0.9)
Monocytes: 8 %
Neutrophils Absolute: 4.1 10*3/uL (ref 1.4–7.0)
Neutrophils: 56 %
Platelets: 328 10*3/uL (ref 150–450)
RBC: 4.23 x10E6/uL (ref 3.77–5.28)
RDW: 12.1 % (ref 11.7–15.4)
WBC: 7.4 10*3/uL (ref 3.4–10.8)

## 2021-06-28 LAB — COMPREHENSIVE METABOLIC PANEL
ALT: 14 IU/L (ref 0–32)
AST: 16 IU/L (ref 0–40)
Albumin/Globulin Ratio: 1.6 (ref 1.2–2.2)
Albumin: 4.1 g/dL (ref 3.8–4.8)
Alkaline Phosphatase: 58 IU/L (ref 44–121)
BUN/Creatinine Ratio: 10 (ref 9–23)
BUN: 12 mg/dL (ref 6–20)
Bilirubin Total: 0.3 mg/dL (ref 0.0–1.2)
CO2: 26 mmol/L (ref 20–29)
Calcium: 9.3 mg/dL (ref 8.7–10.2)
Chloride: 104 mmol/L (ref 96–106)
Creatinine, Ser: 1.16 mg/dL — ABNORMAL HIGH (ref 0.57–1.00)
Globulin, Total: 2.6 g/dL (ref 1.5–4.5)
Glucose: 90 mg/dL (ref 70–99)
Potassium: 4.3 mmol/L (ref 3.5–5.2)
Sodium: 140 mmol/L (ref 134–144)
Total Protein: 6.7 g/dL (ref 6.0–8.5)
eGFR: 62 mL/min/{1.73_m2} (ref >59)

## 2021-06-28 LAB — LIPID PANEL
Chol/HDL Ratio: 2.2 ratio (ref 0.0–4.4)
Cholesterol, Total: 165 mg/dL (ref 100–199)
HDL: 74 mg/dL (ref >39)
LDL Chol Calc (NIH): 82 mg/dL (ref 0–99)
Triglycerides: 40 mg/dL (ref 0–149)
VLDL Cholesterol Cal: 9 mg/dL (ref 5–40)

## 2021-06-28 LAB — VITAMIN D 25 HYDROXY (VIT D DEFICIENCY, FRACTURES): Vit D, 25-Hydroxy: 44.4 ng/mL (ref 30.0–100.0)

## 2021-07-18 ENCOUNTER — Ambulatory Visit (INDEPENDENT_AMBULATORY_CARE_PROVIDER_SITE_OTHER): Payer: No Typology Code available for payment source | Admitting: Obstetrics & Gynecology

## 2021-07-18 ENCOUNTER — Encounter: Payer: Self-pay | Admitting: Obstetrics & Gynecology

## 2021-07-18 VITALS — BP 118/84 | HR 86 | Resp 16 | Ht 65.0 in | Wt 200.0 lb

## 2021-07-18 DIAGNOSIS — N926 Irregular menstruation, unspecified: Secondary | ICD-10-CM

## 2021-07-18 DIAGNOSIS — D250 Submucous leiomyoma of uterus: Secondary | ICD-10-CM

## 2021-07-18 NOTE — Progress Notes (Signed)
    Tonya Perkins [birth year] [id number]        40 y.o.  G0P0000   RP: Preop HSC/Myosure Excision/D+C on 07/31/2021  HPI:  Not bleeding currently.  Irregular menses.  No pelvic pain.  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   OB History  Gravida Para Term Preterm AB Living  0 0 0 0 0 0  SAB IAB Ectopic Multiple Live Births  0 0 0 0 0    Past medical history,surgical history, problem list, medications, allergies, family history and social history were all reviewed and documented in the EPIC chart.   Directed ROS with pertinent positives and negatives documented in the history of present illness/assessment and plan.  Exam:  Vitals:   07/18/21 1201  BP: 118/84  Pulse: 86  Resp: 16  SpO2: 99%  Weight: 200 lb (90.7 kg)  Height: '5\' 5"'$  (1.651 m)   General appearance:  Normal  Pelvic US today: T/V images.  Anteverted uterus normal in size with multiple small intramural fibroids.  The overall uterine size is measured at 9.35 x 4.66 x 4.7 cm.  The endometrial lining is Tri-layered symmetrical measured at 6.2 mm.  A submucosal fibroid is noted measured at 2.8 x 1.9 cm.  Both ovaries are slightly enlarged with positive perfusion bilaterally.  The right ovary is normal in appearance with a follicular pattern.  The left ovary has a normal follicular pattern with a resolving avascular cyst measured at 1.9 x 2 cm.  Trace free fluid in the pelvis.      Assessment/Plan:  41 y.o. G0   1. Irregular bleeding Irregular menses.  No pelvic pain.  Pelvic ultrasound findings thoroughly reviewed.  Patient informed of the presence of a completely submucosal fibroid measured at 2.8 x 1.9 cm.  The submucosal fibroid is probably associated with the irregular bleeding that the patient is experiencing.  We will proceed with excision of the fibroid.   2. Submucous uterine fibroid Irregular vaginal bleeding associated with a submucosal fibroid measured at 2.8 x 1.9 cm.  Decision to proceed with excision.   HSC/Myosure XL Excision/D+C is scheduled for 07/31/2021.  Surgery reviewed thoroughly, including the preop preparation, surgical risks including uterine perforation, infection and DVT/PE, postop expectations and precautions. Pamphlet given last visit.                        Patient was counseled as to the risk of surgery to include the following:  1. Infection (prohylactic antibiotics will be administered)  2. DVT/Pulmonary Embolism (prophylactic pneumo compression stockings will be used)  3.Trauma to internal organs requiring additional surgical procedure to repair any injury to internal organs requiring perhaps additional hospitalization days.  4.Hemmorhage requiring transfusion and blood products which carry risks such as anaphylactic reaction, hepatitis and AIDS  Patient had received literature information on the procedure scheduled and all her questions were answered and fully accepts all risk.    [name] MD, 12:24 PM 07/18/2021

## 2021-07-25 ENCOUNTER — Encounter (HOSPITAL_BASED_OUTPATIENT_CLINIC_OR_DEPARTMENT_OTHER): Payer: Self-pay | Admitting: Obstetrics & Gynecology

## 2021-07-26 ENCOUNTER — Encounter (HOSPITAL_BASED_OUTPATIENT_CLINIC_OR_DEPARTMENT_OTHER): Payer: Self-pay | Admitting: Obstetrics & Gynecology

## 2021-07-31 ENCOUNTER — Ambulatory Visit (HOSPITAL_BASED_OUTPATIENT_CLINIC_OR_DEPARTMENT_OTHER): Payer: PRIVATE HEALTH INSURANCE | Admitting: Anesthesiology

## 2021-07-31 ENCOUNTER — Other Ambulatory Visit: Payer: Self-pay

## 2021-07-31 ENCOUNTER — Encounter (HOSPITAL_BASED_OUTPATIENT_CLINIC_OR_DEPARTMENT_OTHER): Payer: Self-pay | Admitting: Obstetrics & Gynecology

## 2021-07-31 ENCOUNTER — Encounter (HOSPITAL_BASED_OUTPATIENT_CLINIC_OR_DEPARTMENT_OTHER)
Admission: RE | Disposition: A | Discharge status: Home / Self Care | Payer: Self-pay | Source: Home / Self Care | Attending: Obstetrics & Gynecology

## 2021-07-31 ENCOUNTER — Ambulatory Visit (HOSPITAL_BASED_OUTPATIENT_CLINIC_OR_DEPARTMENT_OTHER)
Admission: RE | Admit: 2021-07-31 | Discharge: 2021-07-31 | Disposition: A | Discharge status: Home / Self Care | Payer: PRIVATE HEALTH INSURANCE | Attending: Obstetrics & Gynecology | Admitting: Obstetrics & Gynecology

## 2021-07-31 DIAGNOSIS — Z6833 Body mass index (BMI) 33.0-33.9, adult: Secondary | ICD-10-CM | POA: Diagnosis not present

## 2021-07-31 DIAGNOSIS — N939 Abnormal uterine and vaginal bleeding, unspecified: Secondary | ICD-10-CM | POA: Diagnosis not present

## 2021-07-31 DIAGNOSIS — Z87891 Personal history of nicotine dependence: Secondary | ICD-10-CM | POA: Insufficient documentation

## 2021-07-31 DIAGNOSIS — J45909 Unspecified asthma, uncomplicated: Secondary | ICD-10-CM | POA: Insufficient documentation

## 2021-07-31 DIAGNOSIS — Z01818 Encounter for other preprocedural examination: Secondary | ICD-10-CM

## 2021-07-31 DIAGNOSIS — D250 Submucous leiomyoma of uterus: Secondary | ICD-10-CM

## 2021-07-31 DIAGNOSIS — D259 Leiomyoma of uterus, unspecified: Secondary | ICD-10-CM | POA: Diagnosis not present

## 2021-07-31 DIAGNOSIS — D251 Intramural leiomyoma of uterus: Secondary | ICD-10-CM | POA: Insufficient documentation

## 2021-07-31 DIAGNOSIS — E669 Obesity, unspecified: Secondary | ICD-10-CM | POA: Diagnosis not present

## 2021-07-31 DIAGNOSIS — N926 Irregular menstruation, unspecified: Secondary | ICD-10-CM | POA: Diagnosis present

## 2021-07-31 HISTORY — DX: Irregular menstruation, unspecified: N92.6

## 2021-07-31 HISTORY — PX: DILATATION & CURETTAGE/HYSTEROSCOPY WITH MYOSURE: SHX6511

## 2021-07-31 LAB — CBC
HCT: 39.2 % (ref 36.0–46.0)
Hemoglobin: 13.1 g/dL (ref 12.0–15.0)
MCH: 32.9 pg (ref 26.0–34.0)
MCHC: 33.4 g/dL (ref 30.0–36.0)
MCV: 98.5 fL (ref 80.0–100.0)
Platelets: 327 10*3/uL (ref 150–400)
RBC: 3.98 MIL/uL (ref 3.87–5.11)
RDW: 13.2 % (ref 11.5–15.5)
WBC: 6.6 10*3/uL (ref 4.0–10.5)
nRBC: 0 % (ref 0.0–0.2)

## 2021-07-31 LAB — POCT PREGNANCY, URINE: Preg Test, Ur: NEGATIVE

## 2021-07-31 SURGERY — DILATATION & CURETTAGE/HYSTEROSCOPY WITH MYOSURE
Anesthesia: General | Site: Vagina

## 2021-07-31 MED ORDER — OXYCODONE HCL 5 MG/5ML PO SOLN: 5.0000 mg | Freq: Once | ORAL | Status: DC | PRN

## 2021-07-31 MED ORDER — DEXAMETHASONE SODIUM PHOSPHATE 10 MG/ML IJ SOLN
INTRAMUSCULAR | Status: DC | PRN
  Administered 2021-07-31: 10 mg via INTRAVENOUS

## 2021-07-31 MED ORDER — KETOROLAC TROMETHAMINE 30 MG/ML IJ SOLN
INTRAMUSCULAR | Status: DC | PRN
  Administered 2021-07-31: 30 mg via INTRAVENOUS

## 2021-07-31 MED ORDER — ARTIFICIAL TEARS OPHTHALMIC OINT
TOPICAL_OINTMENT | OPHTHALMIC | Status: AC
  Filled 2021-07-31: qty 3.5

## 2021-07-31 MED ORDER — FENTANYL CITRATE (PF) 100 MCG/2ML IJ SOLN
INTRAMUSCULAR | Status: AC
  Filled 2021-07-31: qty 2

## 2021-07-31 MED ORDER — ACETAMINOPHEN 500 MG PO TABS
ORAL_TABLET | ORAL | Status: AC
  Filled 2021-07-31: qty 2

## 2021-07-31 MED ORDER — OXYCODONE HCL 5 MG PO TABS: 5.0000 mg | ORAL_TABLET | Freq: Once | ORAL | Status: DC | PRN

## 2021-07-31 MED ORDER — ACETAMINOPHEN 500 MG PO TABS
1000.0000 mg | ORAL_TABLET | Freq: Once | ORAL | Status: AC
  Administered 2021-07-31: 1000 mg via ORAL

## 2021-07-31 MED ORDER — ONDANSETRON HCL 4 MG/2ML IJ SOLN: 4.0000 mg | Freq: Once | INTRAMUSCULAR | Status: DC | PRN

## 2021-07-31 MED ORDER — SCOPOLAMINE 1 MG/3DAYS TD PT72
MEDICATED_PATCH | TRANSDERMAL | Status: AC
  Filled 2021-07-31: qty 1

## 2021-07-31 MED ORDER — SODIUM CHLORIDE 0.9 % IR SOLN
Status: DC | PRN
  Administered 2021-07-31: 3000 mL
  Administered 2021-07-31: 1000 mL

## 2021-07-31 MED ORDER — LACTATED RINGERS IV SOLN: INTRAVENOUS | Status: DC

## 2021-07-31 MED ORDER — FENTANYL CITRATE (PF) 100 MCG/2ML IJ SOLN: 25.0000 ug | INTRAMUSCULAR | Status: DC | PRN

## 2021-07-31 MED ORDER — CEFAZOLIN SODIUM-DEXTROSE 2-4 GM/100ML-% IV SOLN
INTRAVENOUS | Status: AC
  Filled 2021-07-31: qty 100

## 2021-07-31 MED ORDER — MIDAZOLAM HCL 2 MG/2ML IJ SOLN
INTRAMUSCULAR | Status: AC
  Filled 2021-07-31: qty 2

## 2021-07-31 MED ORDER — ONDANSETRON HCL 4 MG/2ML IJ SOLN
INTRAMUSCULAR | Status: DC | PRN
  Administered 2021-07-31: 4 mg via INTRAVENOUS

## 2021-07-31 MED ORDER — FENTANYL CITRATE (PF) 250 MCG/5ML IJ SOLN
INTRAMUSCULAR | Status: DC | PRN
  Administered 2021-07-31: 50 ug via INTRAVENOUS
  Administered 2021-07-31 (×2): 25 ug via INTRAVENOUS

## 2021-07-31 MED ORDER — PROPOFOL 10 MG/ML IV BOLUS
INTRAVENOUS | Status: DC | PRN
  Administered 2021-07-31: 150 mg via INTRAVENOUS

## 2021-07-31 MED ORDER — LIDOCAINE 2% (20 MG/ML) 5 ML SYRINGE
INTRAMUSCULAR | Status: DC | PRN
  Administered 2021-07-31: 80 mg via INTRAVENOUS

## 2021-07-31 MED ORDER — LIDOCAINE HCL 1 % IJ SOLN
INTRAMUSCULAR | Status: DC | PRN
  Administered 2021-07-31: 20 mL

## 2021-07-31 MED ORDER — POVIDONE-IODINE 10 % EX SWAB: 2.0000 "application " | Freq: Once | CUTANEOUS | Status: DC

## 2021-07-31 MED ORDER — CEFAZOLIN SODIUM-DEXTROSE 2-4 GM/100ML-% IV SOLN
2.0000 g | INTRAVENOUS | Status: AC
  Administered 2021-07-31: 2 g via INTRAVENOUS

## 2021-07-31 MED ORDER — PROPOFOL 10 MG/ML IV BOLUS
INTRAVENOUS | Status: AC
  Filled 2021-07-31: qty 20

## 2021-07-31 MED ORDER — SCOPOLAMINE 1 MG/3DAYS TD PT72
1.0000 | MEDICATED_PATCH | TRANSDERMAL | Status: DC
  Administered 2021-07-31: 1.5 mg via TRANSDERMAL

## 2021-07-31 MED ORDER — MIDAZOLAM HCL 2 MG/2ML IJ SOLN
INTRAMUSCULAR | Status: DC | PRN
  Administered 2021-07-31: 2 mg via INTRAVENOUS

## 2021-07-31 MED ORDER — AMISULPRIDE (ANTIEMETIC) 5 MG/2ML IV SOLN: 10.0000 mg | Freq: Once | INTRAVENOUS | Status: DC | PRN

## 2021-07-31 SURGICAL SUPPLY — 20 items
DEVICE MYOSURE LITE (MISCELLANEOUS) ×1 IMPLANT
DRSG TELFA 3X8 NADH (GAUZE/BANDAGES/DRESSINGS) ×2 IMPLANT
GAUZE 4X4 16PLY ~~LOC~~+RFID DBL (SPONGE) ×6 IMPLANT
GLOVE BIO SURGEON STRL SZ 6.5 (GLOVE) ×4 IMPLANT
GLOVE BIOGEL PI IND STRL 7.0 (GLOVE) ×4 IMPLANT
GLOVE BIOGEL PI INDICATOR 7.0 (GLOVE) ×4
GOWN STRL REUS W/ TWL LRG LVL3 (GOWN DISPOSABLE) IMPLANT
GOWN STRL REUS W/TWL LRG LVL3 (GOWN DISPOSABLE) ×8 IMPLANT
IV NS 1000ML (IV SOLUTION) ×2
IV NS 1000ML BAXH (IV SOLUTION) IMPLANT
IV NS IRRIG 3000ML ARTHROMATIC (IV SOLUTION) ×3 IMPLANT
KIT PROCEDURE FLUENT (KITS) ×3 IMPLANT
KIT TURNOVER CYSTO (KITS) ×3 IMPLANT
PACK VAGINAL MINOR WOMEN LF (CUSTOM PROCEDURE TRAY) ×3 IMPLANT
PAD DRESSING TELFA 3X8 NADH (GAUZE/BANDAGES/DRESSINGS) ×2 IMPLANT
PAD OB MATERNITY 4.3X12.25 (PERSONAL CARE ITEMS) ×3 IMPLANT
PAD PREP 24X48 CUFFED NSTRL (MISCELLANEOUS) ×3 IMPLANT
SEAL ROD LENS SCOPE MYOSURE (ABLATOR) ×3 IMPLANT
SPIKE FLUID TRANSFER (MISCELLANEOUS) ×1 IMPLANT
TOWEL OR 17X26 10 PK STRL BLUE (TOWEL DISPOSABLE) ×1 IMPLANT

## 2021-07-31 NOTE — Transfer of Care (Signed)
Immediate Anesthesia Transfer of Care Note  Patient: Tonya Perkins  Procedure(s) Performed: DILATATION & CURETTAGE/HYSTEROSCOPY WITH MYOSURE (Vagina )  Patient Location: PACU  Anesthesia Type:General  Level of Consciousness: drowsy  Airway & Oxygen Therapy: Patient Spontanous Breathing  Post-op Assessment: Report given to RN and Post -op Vital signs reviewed and stable  Post vital signs: Reviewed and stable  Last Vitals:  Vitals Value Taken Time  BP    Temp    Pulse    Resp 22 07/31/21 0927  SpO2    Vitals shown include unvalidated device data.  Last Pain:  Vitals:   07/31/21 0709  TempSrc: Oral  PainSc: 0-No pain      Patients Stated Pain Goal: 4 (18/86/77 3736)  Complications: No notable events documented.

## 2021-07-31 NOTE — Anesthesia Preprocedure Evaluation (Signed)
Anesthesia Evaluation  Patient identified by MRN, date of birth, ID band Patient awake    Reviewed: Allergy & Precautions, NPO status , Patient's Chart, lab work & pertinent test results  Airway Mallampati: II  TM Distance: >3 FB Neck ROM: Full    Dental no notable dental hx.    Pulmonary asthma , former smoker,    Pulmonary exam normal breath sounds clear to auscultation       Cardiovascular Exercise Tolerance: Good negative cardio ROS Normal cardiovascular exam Rhythm:Regular Rate:Normal     Neuro/Psych negative neurological ROS  negative psych ROS   GI/Hepatic Neg liver ROS, GERD  ,  Endo/Other  obesity  Renal/GU negative Renal ROS  negative genitourinary   Musculoskeletal negative musculoskeletal ROS (+)   Abdominal   Peds negative pediatric ROS (+)  Hematology negative hematology ROS (+)   Anesthesia Other Findings   Reproductive/Obstetrics negative OB ROS                             Anesthesia Physical Anesthesia Plan  ASA: 2  Anesthesia Plan: General   Post-op Pain Management:    Induction: Intravenous  PONV Risk Score and Plan: 3 and Treatment may vary due to age or medical condition, Scopolamine patch - Pre-op, Dexamethasone, Ondansetron and Midazolam  Airway Management Planned: LMA  Additional Equipment: None  Intra-op Plan:   Post-operative Plan: Extubation in OR  Informed Consent: I have reviewed the patients History and Physical, chart, labs and discussed the procedure including the risks, benefits and alternatives for the proposed anesthesia with the patient or authorized representative who has indicated his/her understanding and acceptance.     Dental advisory given  Plan Discussed with: CRNA and Anesthesiologist  Anesthesia Plan Comments:         Anesthesia Quick Evaluation

## 2021-07-31 NOTE — H&P (Signed)
Tonya Perkins is an 41 y.o. female. G0P0000    RP: HSC/Myosure Excision/D+C on 07/31/2021   HPI:  Not bleeding currently.  Irregular menses.  No pelvic pain.  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    Pertinent Gynecological History: Menses:  Heavy Contraception: condoms Blood transfusions: none Sexually transmitted diseases: past history: HSV/Trichomonas Last pap: normal  OB History: G0  Menstrual History:  Patient's last menstrual period was 07/20/2021.    Past Medical History:  Diagnosis Date   ADD (attention deficit disorder) 10/2014   psych-Dr. Enedina Finner   Allergy    grass pollen,dust mites, dog,cat,molds   Asthma    Genital herpes    History of COVID-19 02/2019   Irregular menses    Smoker    former (quit 04/2012)   Trichomonas vaginalis (TV) infection 09/2019    Past Surgical History:  Procedure Laterality Date   CERVICAL FUSION  2001   DILATATION & CURRETTAGE/HYSTEROSCOPY WITH RESECTOCOPE N/A 03/02/2013   Procedure: DILATATION & CURETTAGE/HYSTEROSCOPY WITH RESECTOCOPE;  Surgeon: [name], MD;  Location: [facility];  Service: Gynecology;  Laterality: N/A;  removal of submucosal myoma   IM NAILING HUMERUS  2001   ORIF    Family History  Problem Relation Age of Onset   Hypertension Mother    Diabetes Mother    Hypertension Father    Cancer Father        prostate   Asthma Brother        childhood    Social History:  reports that she quit smoking about 5 months ago. Her smoking use included cigars and cigarettes. She has never used smokeless tobacco. She reports current alcohol use. She reports that she does not use drugs.  Allergies: No Known Allergies  Medications Prior to Admission  Medication Sig Dispense Refill Last Dose   albuterol (VENTOLIN HFA) 108 (90 Base) MCG/ACT inhaler INHALE 2 PUFF USING INHALER EVERY FOUR TO SIX HOURS AS NEEDED 25.5 g 0 07/30/2021   Azelastine-Fluticasone 137-50 MCG/ACT SUSP Place 1 spray into both nostrils 2 (two)  times daily.   Past Week   cholecalciferol (VITAMIN D3) 25 MCG (1000 UNIT) tablet Take 1,000 Units by mouth daily.   07/30/2021   fluticasone furoate-vilanterol (BREO ELLIPTA) 200-25 MCG/INH AEPB Inhale 1 puff into the lungs daily.   07/31/2021 at 0500   levocetirizine (XYZAL) 5 MG tablet Take 5 mg by mouth in the morning and at bedtime.   07/31/2021 at 0500   montelukast (SINGULAIR) 10 MG tablet Take 10 mg by mouth at bedtime.   07/30/2021   Multiple Vitamins-Minerals (MULTIVITAMIN WITH MINERALS) tablet Take 1 tablet by mouth daily.   Past Week   Olopatadine HCl 0.2 % SOLN SMARTSIG:1 Drop(s) In Eye(s) Daily PRN   Past Week   UNABLE TO FIND Med Name: sea moss pill po   Past Week   ADDERALL XR 5 MG 24 hr capsule Take 1 capsule (5 mg total) by mouth daily. (Patient taking differently: Take 5 mg by mouth as needed.) 30 capsule 0 More than a month   valACYclovir (VALTREX) 500 MG tablet Take 1 tablet twice daily for 5 days for outbreak 90 tablet 0 More than a month    REVIEW OF SYSTEMS: A ROS was performed and pertinent positives and negatives are included in the history. GENERAL: No fevers or chills. HEENT: No change in vision, no earache, sore throat or sinus congestion. NECK: No pain or stiffness. CARDIOVASCULAR: No chest pain or pressure. No palpitations.  PULMONARY: No shortness of breath, cough or wheeze. GASTROINTESTINAL: No abdominal pain, nausea, vomiting or diarrhea, melena or bright red blood per rectum. GENITOURINARY: No urinary frequency, urgency, hesitancy or dysuria. MUSCULOSKELETAL: No joint or muscle pain, no back pain, no recent trauma. DERMATOLOGIC: No rash, no itching, no lesions. ENDOCRINE: No polyuria, polydipsia, no heat or cold intolerance. No recent change in weight. HEMATOLOGICAL: No anemia or easy bruising or bleeding. NEUROLOGIC: No headache, seizures, numbness, tingling or weakness. PSYCHIATRIC: No depression, no loss of interest in normal activity or change in sleep pattern.      Blood pressure (!) 145/99, pulse 74, temperature (!) 96.8 F (36 C), temperature source Oral, resp. rate 19, height '5\' 5"'$  (1.651 m), weight 91.9 kg, last menstrual period 07/20/2021, SpO2 100 %.  Physical Exam:  Results for orders placed or performed during the hospital encounter of 07/31/21 (from the past 24 hour(s))  CBC     Status: None   Collection Time: 07/31/21  7:30 AM  Result Value Ref Range   WBC 6.6 4.0 - 10.5 K/uL   RBC 3.98 3.87 - 5.11 MIL/uL   Hemoglobin 13.1 12.0 - 15.0 g/dL   HCT 39.2 36.0 - 46.0 %   MCV 98.5 80.0 - 100.0 fL   MCH 32.9 26.0 - 34.0 pg   MCHC 33.4 30.0 - 36.0 g/dL   RDW 13.2 11.5 - 15.5 %   Platelets 327 150 - 400 K/uL   nRBC 0.0 0.0 - 0.2 %   Pelvic US 06/14/21: T/V images.  Anteverted uterus normal in size with multiple small intramural fibroids.  The overall uterine size is measured at 9.35 x 4.66 x 4.7 cm.  The endometrial lining is Tri-layered symmetrical measured at 6.2 mm.  A submucosal fibroid is noted measured at 2.8 x 1.9 cm.  Both ovaries are slightly enlarged with positive perfusion bilaterally.  The right ovary is normal in appearance with a follicular pattern.  The left ovary has a normal follicular pattern with a resolving avascular cyst measured at 1.9 x 2 cm.  Trace free fluid in the pelvis.      Assessment/Plan:  41 y.o. G0   1. Irregular bleeding Irregular menses.  No pelvic pain.  Pelvic ultrasound findings thoroughly reviewed.  Patient informed of the presence of a completely submucosal fibroid measured at 2.8 x 1.9 cm.  The submucosal fibroid is probably associated with the irregular bleeding that the patient is experiencing.  We will proceed with excision of the fibroid.   2. Submucous uterine fibroid Irregular vaginal bleeding associated with a submucosal fibroid measured at 2.8 x 1.9 cm.  Decision to proceed with excision. HSC/Myosure XL Excision/D+C is scheduled for 07/31/2021.  Surgery reviewed thoroughly, including the preop  preparation, surgical risks including uterine perforation, infection and DVT/PE, postop expectations and precautions. Pamphlet given last visit.                         Patient was counseled as to the risk of surgery to include the following:   1. Infection (prohylactic antibiotics will be administered)   2. DVT/Pulmonary Embolism (prophylactic pneumo compression stockings will be used)   3.Trauma to internal organs requiring additional surgical procedure to repair any injury to internal organs requiring perhaps additional hospitalization days.   4.Hemmorhage requiring transfusion and blood products which carry risks such as anaphylactic reaction, hepatitis and AIDS   Patient had received literature information on the procedure scheduled and all her questions were  answered and fully accepts all risk.     Marie-Lyne Craig Wisnewski 07/31/2021, 8:24 AM

## 2021-07-31 NOTE — Anesthesia Procedure Notes (Signed)
Procedure Name: LMA Insertion Date/Time: 07/31/2021 8:46 AM Performed by: Clearnce Sorrel, CRNA Pre-anesthesia Checklist: Patient identified, Emergency Drugs available, Suction available and Patient being monitored Patient Re-evaluated:Patient Re-evaluated prior to induction Oxygen Delivery Method: Circle System Utilized Preoxygenation: Pre-oxygenation with 100% oxygen Induction Type: IV induction Ventilation: Mask ventilation without difficulty LMA: LMA inserted LMA Size: 4.0 Number of attempts: 1 Airway Equipment and Method: Bite block Placement Confirmation: positive ETCO2 Tube secured with: Tape Dental Injury: Teeth and Oropharynx as per pre-operative assessment

## 2021-07-31 NOTE — Anesthesia Postprocedure Evaluation (Signed)
Anesthesia Post Note  Patient: Tonya Perkins  Procedure(s) Performed: DILATATION & CURETTAGE/HYSTEROSCOPY WITH MYOSURE (Vagina )     Patient location during evaluation: PACU Anesthesia Type: General Level of consciousness: awake Pain management: pain level controlled Vital Signs Assessment: post-procedure vital signs reviewed and stable Respiratory status: spontaneous breathing and respiratory function stable Cardiovascular status: stable Postop Assessment: no apparent nausea or vomiting Anesthetic complications: no   No notable events documented.  Last Vitals:  Vitals:   07/31/21 0949 07/31/21 1000  BP: (!) 155/112 (!) 148/116  Pulse:    Resp:  15  Temp:  36.4 C  SpO2:      Last Pain:  Vitals:   07/31/21 0930  TempSrc:   PainSc: Asleep                 [name]

## 2021-07-31 NOTE — Discharge Instructions (Addendum)
NO TYLENOL PRODUCTS UNTIL 12:15 PM TODAY.   Post Anesthesia Home Care Instructions  Activity: Get plenty of rest for the remainder of the day. A responsible individual must stay with you for 24 hours following the procedure.  For the next 24 hours, DO NOT: -Drive a car -Paediatric nurse -Drink alcoholic beverages -Take any medication unless instructed by your physician -Make any legal decisions or sign important papers.  Meals: Start with liquid foods such as gelatin or soup. Progress to regular foods as tolerated. Avoid greasy, spicy, heavy foods. If nausea and/or vomiting occur, drink only clear liquids until the nausea and/or vomiting subsides. Call your physician if vomiting continues.  Special Instructions/Symptoms: Your throat may feel dry or sore from the anesthesia or the breathing tube placed in your throat during surgery. If this causes discomfort, gargle with warm salt water. The discomfort should disappear within 24 hours.  If you had a scopolamine patch placed behind your ear for the management of post- operative nausea and/or vomiting:  1. The medication in the patch is effective for 72 hours, after which it should be removed.  Wrap patch in a tissue and discard in the trash. Wash hands thoroughly with soap and water. 2. You may remove the patch earlier than 72 hours if you experience unpleasant side effects which may include dry mouth, dizziness or visual disturbances. 3. Avoid touching the patch. Wash your hands with soap and water after contact with the patch.

## 2021-07-31 NOTE — Op Note (Signed)
Operative Note  07/31/2021  9:25 AM  PATIENT:  Tonya Perkins  41 y.o. female  PRE-OPERATIVE DIAGNOSIS:  Submucosal Fibroid, abnormal uterine bleeding  POST-OPERATIVE DIAGNOSIS:  Partially submucosal Fibroid, abnormal uterine bleeding  PROCEDURE:  Procedure(s): DILATATION & CURETTAGE/HYSTEROSCOPY WITH MYOSURE EXCISION  SURGEON:  Surgeon(s): [name], MD  ANESTHESIA:   general  FINDINGS: Both ostia visualized.  Anterior mid body partially submucosal fibroid, about 20%.  Rest of fibroid is intramural.  DESCRIPTION OF OPERATION: Under general anesthesia with laryngeal mask, the patient is in lithotomy position.  She is prepped with Betadine on the suprapubic, vulvar and vaginal areas.  She is draped as usual.  Timeout is done.  The uterus is anteverted, normal volume, mobile.  No adnexal mass felt.  The speculum is inserted in the vagina.  The anterior lip of the cervix is grasped with a tenaculum.  A paracervical block is done with lidocaine 1% a total of 20 cc at 4 and 8:00.  Dilation of the cervix with Pratt dilators up to #21 without difficulty.  The operative hysteroscope is inserted in the intra uterine cavity.  Inspection reveals 2 normal ostia.  The anterior wall presents a bulging representing a partially submucosal fibroid.  The submucosal portion is estimated at about 20%.  No other lesion is seen in the intra uterine cavity.  The MyoSure XL is inserted through the hysteroscope.  We proceeded with excision of the submucosal part of the fibroid.  Hemostasis is adequate.  Pictures are taken after excision.  The hysteroscope with MyoSure are removed.  A systematic curettage of the endometrial cavity is done with a sharp curette.  Both specimen are sent together to pathology. The curette is removed from the uterine cavity. The tenaculum was removed from the anterior lip of the cervix.  Hemostasis is adequate.  The speculum is removed.  The patient is brought to recovery room in  good and stable status.  ESTIMATED BLOOD LOSS: 5 mL FLUID DEFICIT: 1415 mL  Intake/Output Summary (Last 24 hours) at 07/31/2021 0925 Last data filed at 07/31/2021 0914 Gross per 24 hour  Intake 200 ml  Output 5 ml  Net 195 ml     BLOOD ADMINISTERED:none   LOCAL MEDICATIONS USED:  LIDOCAINE 1% 20 cc for Paracervical Block  SPECIMEN:  Source of Specimen:  Excision material from Submucosal Fibroid and Endometrial Curettings.  DISPOSITION OF SPECIMEN:  PATHOLOGY  COUNTS:  YES  PLAN OF CARE: Transfer to PACU  Tonya LavoieMD9:25 AM

## 2021-08-01 ENCOUNTER — Encounter (HOSPITAL_BASED_OUTPATIENT_CLINIC_OR_DEPARTMENT_OTHER): Payer: Self-pay | Admitting: Obstetrics & Gynecology

## 2021-08-01 LAB — SURGICAL PATHOLOGY

## 2021-08-12 NOTE — Progress Notes (Deleted)
  No chief complaint on file.   Patient presents for follow-up on her blood pressure. It was noted to be quite elevated at her physical last month. Repeat was lower, but diastolic was still 026. Low sodium diet was reviewed, discussed BP monitoring, goals, and risks of HTN.  BP's have been running  ADD:  had been managing well without meds.  Refilled low dose adderall to have on hand for prn use with school.  She was advised to monitor BP while taking the medication.  Used? BP?   Since her last visit she underwent D&C, hysteroscopy with myosure. Pathology was benign, submucosal fibroid. Bleeding?   PMH, PSH, SH reviewed    ROS:  no fever, chills, URI symptoms, headaches, dizziness, chest pain, palpitations. Denies edema. No n/v/d, bowel changes.  Moods are good. Vaginal bleeding?   PHYSICAL EXAM:  LMP 07/20/2021   Wt Readings from Last 3 Encounters:  07/31/21 202 lb 9.6 oz (91.9 kg)  07/18/21 200 lb (90.7 kg)  06/27/21 202 lb (91.6 kg)      ASSESSMENT/PLAN:    EKG if BP high Plan HCTZ if BP's above goal

## 2021-08-13 ENCOUNTER — Encounter: Payer: No Typology Code available for payment source | Admitting: Family Medicine

## 2021-08-16 ENCOUNTER — Encounter: Payer: No Typology Code available for payment source | Admitting: Family Medicine

## 2021-08-16 ENCOUNTER — Encounter: Payer: Self-pay | Admitting: Obstetrics & Gynecology

## 2021-08-16 ENCOUNTER — Ambulatory Visit (INDEPENDENT_AMBULATORY_CARE_PROVIDER_SITE_OTHER): Payer: No Typology Code available for payment source | Admitting: Obstetrics & Gynecology

## 2021-08-16 VITALS — BP 114/76

## 2021-08-16 DIAGNOSIS — Z09 Encounter for follow-up examination after completed treatment for conditions other than malignant neoplasm: Secondary | ICD-10-CM

## 2021-08-16 DIAGNOSIS — N9089 Other specified noninflammatory disorders of vulva and perineum: Secondary | ICD-10-CM | POA: Diagnosis not present

## 2021-08-16 MED ORDER — CLOBETASOL PROPIONATE 0.05 % EX OINT: 1.0000 | TOPICAL_OINTMENT | Freq: Every day | CUTANEOUS | 1 refills | Status: AC | PRN

## 2021-08-16 MED ORDER — FLUCONAZOLE 150 MG PO TABS: 150.0000 mg | ORAL_TABLET | Freq: Once | ORAL | 1 refills | Status: AC

## 2021-08-21 NOTE — Progress Notes (Deleted)
  No chief complaint on file.  Patient presents for 6 week follow-up on her blood pressure.  It was noted to be elevated at her physical.  We discussed low sodium diet, exercise, weight loss, and to try and monitor BP.    BP's have been running  Previously noted higher BP when not getting enough sleep, or eating more salt (chips). Occasional headache. Denies chest pain, palpitations, edema, DOE/SOB.  BP Readings from Last 3 Encounters:  08/16/21 114/76  07/31/21 (!) 146/104  07/18/21 118/84   H/o ADD.  She previously used adderall when in school.  She is back in school now for RN. At her physical she reported focusing okay, grades had been okay.  She requested refill to have on hand, for exams, clinicals. She was prescribed Adderall XR '5mg'$ . Taken? Monitored BP?     Vitamin D deficiency was noted 06/2019, with level of 11.2.  Last level was normal at 44.4 in 06/2021, when taking 5000 IU daily (most days).  PMH, PSH, SH reviewed   ROS:  no fever, chills, URI symptoms, chest pain, shortness of breath, palpitations, edema.  Denies n/v/d. Moods are good. Recovered from GYN procedure, denies bleeding   PHYSICAL EXAM:  LMP 08/11/2021   Wt Readings from Last 3 Encounters:  07/31/21 202 lb 9.6 oz (91.9 kg)  07/18/21 200 lb (90.7 kg)  06/27/21 202 lb (91.6 kg)      ASSESSMENT/PLAN:   If above goal, start HCTZ.

## 2021-08-22 ENCOUNTER — Encounter: Payer: No Typology Code available for payment source | Admitting: Family Medicine

## 2021-08-22 ENCOUNTER — Telehealth: Payer: Self-pay | Admitting: *Deleted

## 2021-08-22 NOTE — Telephone Encounter (Signed)
Needs no show letter. Looks like she rescheduled this a couple of times, then didn't show. In letter, ask her to reschedule.

## 2021-08-22 NOTE — Telephone Encounter (Signed)

## 2021-08-24 ENCOUNTER — Encounter: Payer: Self-pay | Admitting: Family Medicine

## 2021-09-29 ENCOUNTER — Other Ambulatory Visit (HOSPITAL_COMMUNITY): Payer: Self-pay

## 2021-10-02 NOTE — Progress Notes (Unsigned)
  No chief complaint on file.   Patient was last seen in May at her physical, and BP was noted to be high (150/100, 130/100 on repeat by MD),  She was counseled re: low sodium diet, exercise, weight loss. She was to monitor her BP at home and f/u in 6 wks with list and monitor.  This is her f/u visit.  At her physical she reported noting higher BP's when not getting enough sleep, eating more salt (ie chips).  BP's have been running  Denies chest pain, palpitations, edema.  BP Readings from Last 3 Encounters:  08/16/21 114/76  07/31/21 (!) 146/104  07/18/21 118/84   Smoking black & milds??   PMH, PSH, SH reviewed   ROS:  no fever, chills, URI symptoms, headaches, dizziness, chest pain, shortness of breath, edema. No abnormal vaginal bleeding, urinary complaints. Moods   PHYSICAL EXAM:  There were no vitals taken for this visit.  Wt Readings from Last 3 Encounters:  07/31/21 202 lb 9.6 oz (91.9 kg)  07/18/21 200 lb (90.7 kg)  06/27/21 202 lb (91.6 kg)      ASSESSMENT/PLAN:

## 2021-10-03 ENCOUNTER — Encounter: Payer: Self-pay | Admitting: Family Medicine

## 2021-10-03 ENCOUNTER — Ambulatory Visit (INDEPENDENT_AMBULATORY_CARE_PROVIDER_SITE_OTHER): Payer: No Typology Code available for payment source | Admitting: Family Medicine

## 2021-10-03 ENCOUNTER — Other Ambulatory Visit (HOSPITAL_COMMUNITY): Payer: Self-pay

## 2021-10-03 VITALS — BP 150/100 | HR 90 | Wt 205.4 lb

## 2021-10-03 DIAGNOSIS — Z6834 Body mass index (BMI) 34.0-34.9, adult: Secondary | ICD-10-CM | POA: Diagnosis not present

## 2021-10-03 DIAGNOSIS — I10 Essential (primary) hypertension: Secondary | ICD-10-CM

## 2021-10-03 MED ORDER — HYDROCHLOROTHIAZIDE 25 MG PO TABS
25.0000 mg | ORAL_TABLET | Freq: Every day | ORAL | 0 refills | Status: DC
  Filled 2021-10-03: qty 90, 90d supply, fill #0

## 2021-10-03 NOTE — Patient Instructions (Signed)
Please work on low sodium diet, regular exercise and weight loss as we discussed. Continue to monitor your blood pressure and record the values.  Start HCTZ 1/2 tablet upon wakening. If after 1 week your blood pressure is still >135-140/85-90, then increase to the full tablet. Be sure to get some potassium rich foods in your diet (this may be in your Mrs. Dash--read the label, also bananas, potatoes). Stay well hydrated. Return in 4 weeks with your list of blood pressures (and your monitor if you want it rechecked).

## 2021-10-26 ENCOUNTER — Other Ambulatory Visit (HOSPITAL_COMMUNITY): Payer: Self-pay

## 2021-10-26 ENCOUNTER — Telehealth: Payer: Self-pay

## 2021-10-26 MED ORDER — FLUCONAZOLE 100 MG PO TABS
ORAL_TABLET | ORAL | 0 refills | Status: AC
  Filled 2021-10-26: qty 11, 10d supply, fill #0

## 2021-10-26 MED ORDER — METRONIDAZOLE 500 MG PO TABS
500.0000 mg | ORAL_TABLET | Freq: Two times a day (BID) | ORAL | 0 refills | Status: AC
  Filled 2021-10-26: qty 14, 7d supply, fill #0

## 2021-10-26 NOTE — Telephone Encounter (Signed)
Pt calling to report vaginal itching/irritation. States she is currently on her cycle but feeling that she has a yeast infection. Please advise.   FYI. Last annual seen 05/2020.

## 2021-10-26 NOTE — Telephone Encounter (Signed)
Dr.Lavoie replied "Schedule Annual Gyn visit.  Fluconazole 150 mg 1 tab PO every other day x 3. "  Left message to find out which pharmacy to send Rx.

## 2021-10-29 NOTE — Progress Notes (Deleted)
No chief complaint on file.   Patient presents for 4 week follow-up on hypertension. She was started on HCTZ at her visit last month, starting at 1/2 of 76m tablet, and increasing to full tablet after a week  We had discussed low sodium diet, potassium rich foods, and the need to stay well hydrated.  BP's have been running   She denies dizziness, muscle cramps, chest pain. Denies headaches.   PMH, PSH, SH reviewed    ROS: no fever, chills, URI symptoms, cough, shortness of breath, chest pain, palpitations, edema. Denies n/v/d. Headaches?   PHYSICAL EXAM:  There were no vitals taken for this visit.  Wt Readings from Last 3 Encounters:  10/03/21 205 lb 6.4 oz (93.2 kg)  07/31/21 202 lb 9.6 oz (91.9 kg)  07/18/21 200 lb (90.7 kg)   Well-appearing, pleasant female in no distress HEENT: conjunctiva and sclera are clear, EOMI Neck: no lymphadenopathy, thyromegaly or bruit Heart: regular rate and rhythm, no murmur Lungs: clear bilaterally Abdomen: soft, nontender Extremities: no edema Psych: normal mood, affect, hygiene and grooming Neuro: alert and oriented, cranial nerves grossly intact. Normal gait.   ASSESSMENT/PLAN:   B-met

## 2021-10-30 ENCOUNTER — Other Ambulatory Visit (HOSPITAL_COMMUNITY): Payer: Self-pay

## 2021-10-30 ENCOUNTER — Ambulatory Visit: Payer: No Typology Code available for payment source | Admitting: Radiology

## 2021-10-30 DIAGNOSIS — Z0289 Encounter for other administrative examinations: Secondary | ICD-10-CM

## 2021-10-30 MED ORDER — PENICILLIN V POTASSIUM 500 MG PO TABS
500.0000 mg | ORAL_TABLET | Freq: Three times a day (TID) | ORAL | 0 refills | Status: DC
  Filled 2021-10-30 (×2): qty 30, 10d supply, fill #0

## 2021-10-31 ENCOUNTER — Encounter: Payer: Self-pay | Admitting: Internal Medicine

## 2021-10-31 ENCOUNTER — Encounter: Payer: No Typology Code available for payment source | Admitting: Family Medicine

## 2021-10-31 DIAGNOSIS — I10 Essential (primary) hypertension: Secondary | ICD-10-CM

## 2021-10-31 NOTE — Telephone Encounter (Signed)
Per chart, pt was prescribed fluconazole by another provider. Will close encounter.

## 2021-11-02 ENCOUNTER — Other Ambulatory Visit: Payer: Self-pay | Admitting: Family Medicine

## 2021-11-02 ENCOUNTER — Other Ambulatory Visit: Payer: Self-pay | Admitting: Obstetrics & Gynecology

## 2021-11-02 MED ORDER — VALACYCLOVIR HCL 500 MG PO TABS
ORAL_TABLET | ORAL | 0 refills | Status: DC
Start: 2021-11-02 — End: 2022-09-24

## 2021-11-02 MED ORDER — ALBUTEROL SULFATE HFA 108 (90 BASE) MCG/ACT IN AERS
1.0000 | INHALATION_SPRAY | Freq: Four times a day (QID) | RESPIRATORY_TRACT | 0 refills | Status: DC | PRN
Start: 2021-11-02 — End: 2022-05-20

## 2021-11-02 NOTE — Telephone Encounter (Signed)
Pt requesting Rx refill on valtrex. Last AEX 05/2020--nothing scheduled as of yet.  Sent mychart msg re: getting scheduled. Please advise.

## 2021-11-07 ENCOUNTER — Ambulatory Visit: Payer: No Typology Code available for payment source | Admitting: Obstetrics & Gynecology

## 2021-11-07 NOTE — Progress Notes (Deleted)
No chief complaint on file.   Patient presents for 4 week follow-up on hypertension. She was started on HCTZ at her visit last month, starting at 1/2 of 75m tablet, and increasing to full tablet after a week   We had discussed low sodium diet, potassium rich foods, and the need to stay well hydrated.   BP's have been running     She denies dizziness, muscle cramps, chest pain. Denies headaches.     PMH, PSH, SH reviewed       ROS: no fever, chills, URI symptoms, cough, shortness of breath, chest pain, palpitations, edema. Denies n/v/d. Headaches?     PHYSICAL EXAM:   There were no vitals taken for this visit.  Wt Readings from Last 3 Encounters:  10/03/21 205 lb 6.4 oz (93.2 kg)  07/31/21 202 lb 9.6 oz (91.9 kg)  07/18/21 200 lb (90.7 kg)   Well-appearing, pleasant female in no distress HEENT: conjunctiva and sclera are clear, EOMI Neck: no lymphadenopathy, thyromegaly or bruit Heart: regular rate and rhythm, no murmur Lungs: clear bilaterally Abdomen: soft, nontender Extremities: no edema Psych: normal mood, affect, hygiene and grooming Neuro: alert and oriented, cranial nerves grossly intact. Normal gait.     ASSESSMENT/PLAN:   Flu shot [facility]  B-met  No RF (#90 of HCTZ rx'd 8/9)

## 2021-11-08 ENCOUNTER — Encounter: Payer: No Typology Code available for payment source | Admitting: Family Medicine

## 2021-11-08 ENCOUNTER — Telehealth: Payer: Self-pay | Admitting: *Deleted

## 2021-11-08 DIAGNOSIS — I10 Essential (primary) hypertension: Secondary | ICD-10-CM

## 2021-11-08 NOTE — Telephone Encounter (Signed)
Needs no show letter. She cancelled same day recently (didn't feel well), and no-showed today's visit. She is due for a visit to follow-up on her blood pressure

## 2021-11-08 NOTE — Telephone Encounter (Signed)

## 2021-11-13 ENCOUNTER — Encounter: Payer: Self-pay | Admitting: Family Medicine

## 2021-11-13 NOTE — Telephone Encounter (Signed)
As of 11/12/2021: Tillie Rung D, CMA Two messages have been left for patient to call and schedule appointment; patient has not called back.

## 2021-12-04 ENCOUNTER — Encounter: Payer: Self-pay | Admitting: Internal Medicine

## 2021-12-26 ENCOUNTER — Other Ambulatory Visit: Payer: Self-pay | Admitting: Family Medicine

## 2021-12-26 NOTE — Telephone Encounter (Signed)
Deny it.  She no showed her visit in September to f/u on her HTN, didn't reschedule.  If needing that much, needs visit (needs visit anyway)

## 2021-12-26 NOTE — Telephone Encounter (Signed)
Tried to call patient since you filled in Sept to see if she needed this, no answer. This is the type of refill that is initiated by the patient through MyChart so I am assuming she does need.

## 2022-01-11 ENCOUNTER — Other Ambulatory Visit (HOSPITAL_COMMUNITY): Payer: Self-pay

## 2022-01-30 ENCOUNTER — Other Ambulatory Visit (HOSPITAL_COMMUNITY): Payer: Self-pay

## 2022-03-11 ENCOUNTER — Encounter: Payer: Self-pay | Admitting: Family Medicine

## 2022-03-11 ENCOUNTER — Ambulatory Visit (INDEPENDENT_AMBULATORY_CARE_PROVIDER_SITE_OTHER): Payer: No Typology Code available for payment source | Admitting: Family Medicine

## 2022-03-11 VITALS — BP 122/78 | HR 80 | Ht 65.0 in | Wt 198.8 lb

## 2022-03-11 DIAGNOSIS — Z7185 Encounter for immunization safety counseling: Secondary | ICD-10-CM

## 2022-03-11 DIAGNOSIS — I10 Essential (primary) hypertension: Secondary | ICD-10-CM | POA: Diagnosis not present

## 2022-03-11 DIAGNOSIS — Z5181 Encounter for therapeutic drug level monitoring: Secondary | ICD-10-CM

## 2022-03-11 DIAGNOSIS — N979 Female infertility, unspecified: Secondary | ICD-10-CM

## 2022-03-11 DIAGNOSIS — M7061 Trochanteric bursitis, right hip: Secondary | ICD-10-CM | POA: Diagnosis not present

## 2022-03-11 MED ORDER — MELOXICAM 15 MG PO TABS: 15.0000 mg | ORAL_TABLET | Freq: Every day | ORAL | 0 refills | Status: DC

## 2022-03-11 NOTE — Progress Notes (Unsigned)
Chief Complaint  Patient presents with   Hip Pain    Right hip pain x 2 months and bp follow up. Using BioFreeze and tylenol. Patient states she is taking 1.5 tablets of HCTZ.   Patient presents with complaint of pain across the front of the R hip for 2 months. Pain starts at the front, but radiates around laterally to the buttock.  She walks a lot at work, and first noticed the pain while at work. When she walks a lot, the pain radiates down the side of the leg, to just below the knee. No pain with standing. It gets triggered with walking a lot, if she crosses her leg a certain way. Stretching helps the pain.  She has been using tylenol for pain--twice in the last 3 days, doesn't seem to help as much as it did initially.  Hypertension follow-up: She was started on HCTZ in August 2023, and never followed up afterwards.  She was to start at 1/2 of '25mg'$  tablet, and increase to full tablet after a week (if BP remained above goal). She recalls taking it for about 3 weeks (full tablet) and then forgot. Got out of the routine. She started having headaches, could tell her BP was up, so started checking BP again.  139-142/100's. She restarted the HCTZ at 1.5 tablets (37.'5mg'$ ), because it was so high, and has been taking this daily since early December.  BP yesterday at work was 136/89 (when busy, no time to rest). At home she saw 128/74.  Denies muscle cramps.  She eats some potatoes, no bananas. No further headaches, denies dizziness, muscle cramps, chest pain, edema  She has been walking more (3x/week for 1.5 hours), and doing an online stretch course.  She is asking to have TSH checked--planning to start fertility workup soon, but would like the test done today (since blood is being drawn to check electrolytes due to HCTZ).  Lab Results  Component Value Date   TSH 0.498 07/12/2019      PMH, PSH, SH reviewed   Outpatient Encounter Medications as of 03/11/2022  Medication Sig Note    acetaminophen (TYLENOL) 500 MG tablet Take 1,000 mg by mouth every 6 (six) hours as needed. 03/11/2022: Last dose yesterday at 11:00am   Azelastine-Fluticasone 137-50 MCG/ACT SUSP Place 1 spray into both nostrils 2 (two) times daily.    fluticasone furoate-vilanterol (BREO ELLIPTA) 200-25 MCG/INH AEPB Inhale 1 puff into the lungs daily.    hydrochlorothiazide (HYDRODIURIL) 25 MG tablet Take 1 tablet (25 mg total) by mouth daily. 03/11/2022: Started at 37.'5mg'$  about 6 weeks ago (December)   levocetirizine (XYZAL) 5 MG tablet Take 5 mg by mouth in the morning and at bedtime.    Multiple Vitamins-Minerals (MULTIVITAMIN WITH MINERALS) tablet Take 1 tablet by mouth daily.    Olopatadine HCl 0.2 % SOLN SMARTSIG:1 Drop(s) In Eye(s) Daily PRN    albuterol (VENTOLIN HFA) 108 (90 Base) MCG/ACT inhaler Inhale 1-2 puffs into the lungs every 6 (six) hours as needed for wheezing or shortness of breath. (Patient not taking: Reported on 03/11/2022) 03/11/2022: prn   cholecalciferol (VITAMIN D3) 25 MCG (1000 UNIT) tablet Take 5,000 Units by mouth daily.    clobetasol ointment (TEMOVATE) 9.56 % Apply 1 Application topically daily as needed. Thin vulvar application on affected vulva as needed (Patient not taking: Reported on 10/03/2021) 03/11/2022: prn   montelukast (SINGULAIR) 10 MG tablet Take 10 mg by mouth at bedtime. (Patient not taking: Reported on 03/11/2022)    valACYclovir (  VALTREX) 500 MG tablet Take 1 tablet twice daily for 5 days for outbreak (Patient not taking: Reported on 03/11/2022) 03/11/2022: prn   [DISCONTINUED] penicillin v potassium (VEETID) 500 MG tablet Take 1 tablet (500 mg total) by mouth 3 (three) times daily for 10 days    [DISCONTINUED] UNABLE TO FIND Med Name: sea moss pill po (Patient not taking: Reported on 10/03/2021) 06/27/2021: Doesn't take daily.  "Holistic med to help with blood pressure, cholesterol"   No facility-administered encounter medications on file as of 03/11/2022.   No Known  Allergies   ROS: no fever, chills, URI symptoms, cough, shortness of breath, chest pain, palpitations, edema. Headaches resolved. Denies n/v/d. R hip pain per HPI +intentional weight loss   PHYSICAL EXAM:  BP 122/78   Pulse 80   Ht '5\' 5"'$  (1.651 m)   Wt 198 lb 12.8 oz (90.2 kg)   LMP 02/22/2022 (Exact Date)   BMI 33.08 kg/m   Wt Readings from Last 3 Encounters:  03/11/22 198 lb 12.8 oz (90.2 kg)  10/03/21 205 lb 6.4 oz (93.2 kg)  07/31/21 202 lb 9.6 oz (91.9 kg)   Well-appearing, pleasant female in good spirits HEENT: conjunctiva and sclera are clear, EOMI. Neck: no lymphadenopathy, thyromegaly or mass, no bruit Heart: regular rate and rhythm Lungs: clear bilaterally Back: no spinal or CVA tenderness Extremities: FROM at hips, without pain. Some pain with right hip flexion against resistance. Normal strength Some pain with pyriformis stretch (discomfort is laterally at hip) Tender to palpation over R trochanteric bursa Pain with IT band stretch on the right. Nontender along the IT band with palpation. Neuro: normal strength, sensation, negative SLR. DTR's symmetric. Normal gait Psych: normal mood, affect, hygiene and grooming   ASSESSMENT/PLAN:  Essential hypertension - BP normal today. Pt had been noncompliant, had high BP and HAs and started HCTZ above recommended dose. Low Na diet reviewed - Plan: Basic metabolic panel  Trochanteric bursitis of right hip - Also some component of pain related to IT band. Shown stretches.  Course of NSAIDs, risks reviewed. Consider PT if not improving - Plan: meloxicam (MOBIC) 15 MG tablet  Medication monitoring encounter - Plan: Basic metabolic panel  Infertility, female - pt requesting TSH, planning for full workup soon - Plan: TSH  Immunization counseling - strongly recommended influenza and COVID boosters (pt at higher risk, with asthma).  Will get flu another day, declines COVID.  Patient taking more HCTZ than was prescribed.  Given that BP was at goal, will check electrolytes today. If all good, she can continue 37.'5mg'$  dose.  Reviewed dietary sources of potassium and low sodium diet in detail.  Shown stretches, exercises, discussed NSAID risks/precautions. PT if not improving. Reassurred that this doesn't seem to be related to her hip joint (FROM without pain), but if persistent/worsening/changing, we can check x-ray.  Asked patient to get [place] Tdap date (she had said she got it 04/2019, need exact date to enter into her chart)  F/u as scheduled for CPE in May, sooner prn.  I spent 45 minutes dedicated to the care of this patient, including pre-visit review of records, face to face time, post-visit ordering of testing and documentation.

## 2022-03-11 NOTE — Patient Instructions (Addendum)
Try and monitor your blood pressure routinely. Try and limit the sodium intake (soups, canned foods, processed foods including lunch meats, olives, pickles, soy sauce, fast foods, french fries, mashed potatoes, etc).  Ideally, I'd like for you to work on your diet and see if you will be able to decrease the HCTZ to '25mg'$  (1 pill, rather than 1.5).  You can continue the 1.5 tablets for now, as you started on your own, as it seems to be effective.  If you can make some changes in the diet and continue to work on weight loss, perhaps you'll be able to get down to just '25mg'$ .  We will be checking your potassium today to ensure that you are getting enough from dietary sources.  I'd use more bananas than potatotes (just to lower carbs and many potatoes are heavily salted).  PLEASE get your flu shot sooner rather than later (it is in full force in the community right now).  Send [place] a message with the date of your last tetanus (TdaP, ?04/2019?)  Do the stretches as shown for your IT band. Look at the exercises for bursitis and IT band.  We are going to treat you with an anti-inflammatory. Take it daily with food until your pain has resolved.  If it bothers your stomach, cut the dose in half. Do NOT take any ibuprofen/advil/motrin/aleve/naproxen/Goody or BC while taking the prescription medication.  This medication can sometimes increase blood pressure, but so can pain.  Continue to monitor your blood pressure.

## 2022-03-12 ENCOUNTER — Encounter: Payer: Self-pay | Admitting: Family Medicine

## 2022-03-12 LAB — BASIC METABOLIC PANEL
BUN/Creatinine Ratio: 13 (ref 9–23)
BUN: 13 mg/dL (ref 6–24)
CO2: 23 mmol/L (ref 20–29)
Calcium: 10.3 mg/dL — ABNORMAL HIGH (ref 8.7–10.2)
Chloride: 103 mmol/L (ref 96–106)
Creatinine, Ser: 1.02 mg/dL — ABNORMAL HIGH (ref 0.57–1.00)
Glucose: 73 mg/dL (ref 70–99)
Potassium: 4.1 mmol/L (ref 3.5–5.2)
Sodium: 138 mmol/L (ref 134–144)
eGFR: 71 mL/min/{1.73_m2} (ref >59)

## 2022-03-12 LAB — TSH: TSH: 0.286 u[IU]/mL — ABNORMAL LOW (ref 0.450–4.500)

## 2022-03-12 MED ORDER — HYDROCHLOROTHIAZIDE 25 MG PO TABS: 37.5000 mg | ORAL_TABLET | Freq: Every day | ORAL | 1 refills | Status: DC

## 2022-03-13 LAB — T4, FREE: Free T4: 1.34 ng/dL (ref 0.82–1.77)

## 2022-03-13 LAB — T3, FREE: T3, Free: 2.8 pg/mL (ref 2.0–4.4)

## 2022-03-13 LAB — SPECIMEN STATUS REPORT

## 2022-03-18 ENCOUNTER — Encounter: Payer: Self-pay | Admitting: Family Medicine

## 2022-03-18 ENCOUNTER — Telehealth: Payer: Self-pay | Admitting: Family Medicine

## 2022-03-18 DIAGNOSIS — M7061 Trochanteric bursitis, right hip: Secondary | ICD-10-CM

## 2022-03-18 MED ORDER — MELOXICAM 15 MG PO TABS: 15.0000 mg | ORAL_TABLET | Freq: Every day | ORAL | 0 refills | Status: DC

## 2022-03-18 NOTE — Telephone Encounter (Signed)
Ok to refill (but ?if insurance will cover). Looks like I sent the last one to a different pharmacy than what is listed in her chart.  Not sure where she wanted it sent, so pending it.

## 2022-03-18 NOTE — Telephone Encounter (Signed)
Tonya Perkins called and is asking for another prescription for her meloxicam because she can't find them and thinks she threw them away. Also expresses she is in pain. Uses  [name], [name]

## 2022-03-25 ENCOUNTER — Telehealth: Payer: Self-pay

## 2022-03-25 ENCOUNTER — Ambulatory Visit: Payer: No Typology Code available for payment source | Admitting: Obstetrics & Gynecology

## 2022-03-25 ENCOUNTER — Ambulatory Visit: Payer: PRIVATE HEALTH INSURANCE

## 2022-03-25 NOTE — Telephone Encounter (Signed)
Per ML: "Urgent Care can address that problem."  Spoke with pt and she reports that she was able to get condom out and is relieved. She will contact us if anything further is needed.

## 2022-03-25 NOTE — Telephone Encounter (Signed)
Pt calling to report that she has condom stuck inside vagina and is unable to remove on her own and is requesting appt today. However, looks like pt is scheduled for an appt w/ an UC @ 3pm. Please advise.

## 2022-05-20 ENCOUNTER — Other Ambulatory Visit: Payer: Self-pay | Admitting: Family Medicine

## 2022-05-20 MED ORDER — ALBUTEROL SULFATE HFA 108 (90 BASE) MCG/ACT IN AERS: 1.0000 | INHALATION_SPRAY | Freq: Four times a day (QID) | RESPIRATORY_TRACT | 0 refills | Status: DC | PRN

## 2022-05-20 NOTE — Telephone Encounter (Signed)
Is this okay to refill? 

## 2022-06-29 NOTE — Progress Notes (Unsigned)
No chief complaint on file.   Tonya Perkins is a 42 y.o. female who presents for a complete physical.   She was last seen in January when she had complaint of pain across the front of the R hip, radiating laterally to the buttock. At that time she had the pain for 2 months.  When she walks a lot, the pain radiates down the side of the leg, to just below the knee, also has discomfort if she crosses her leg a certain way.  Relieved by stretching, no pain with standing. Exam at that time was consistent with R trochanteric bursitis, and some component of IT band syndrome. She was prescribed a course of meloxicam, shown stretches, and advised next step would be PT if not improving.  Hypertension follow-up: She was started on HCTZ in August 2023, and never followed up afterwards.  She only took the medication for about 3 weeks, then got out of the routine and forgot. She started checking BP's again after she started having headaches in 01/2022. She restarted HCTZ at 37.5mg  (of her own volition), when seeing bp's 140/100's. BP's were improved, HA's resolved, no muscle cramps or other issues.  She is currently taking BP's are running  Denies headaches, dizziness, chest pain, muscle cramps, edema.  She is under the care of Dr. Seymour Bars, is s/p D&C/hysteroscopy with myosure on 07/31/21 for submucosal uterine fibroid (causing abnormal bleeding). She denies further bleeding. She has appt scheduled with Dr. Seymour Bars for June.  She is under the care of allergist for allergies and asthma.  She is using Breo, singulair, and albuterol prn for asthma. She uses it 3x/week, just once a day, while at work if very active. She is on Xyzal BID.   UPDATE MEDS *** She is on immunotherapy (started a year ago). Allergies seem to be okay currently.  She had quit smoking, but restarted 12/2018 when stress began related to her husband's mental health.  1-2 cigars (black and mild).  She quit smoking again 03/2020.  GERD--  Only occasional, related to her diet (spaghetti sauce or greasy foods) . Rarely takes a Tums.  H/o ADD.  She previously used adderall when in school.  She is back in school now for RN. She is able to focus well (turning off TV, keeping it quiet) and grades have been fine. Last year she requested a RF to have on hand for exams, clinicals. NOT FILLED? UPDATE  Vitamin D deficiency was noted 06/2019, with level of 11.2.  Last level was 44.4 in 06/2021, when taking 5000 IU daily (most days). She is currently taking   Immunization History  Administered Date(s) Administered   DTaP 02/05/1982, 04/10/1982, 08/16/1982, 03/03/1987   Hepatitis B 11/28/1993, 01/02/1994, 06/12/1994, 08/11/2014   IPV 02/05/1982, 04/10/1982, 03/03/1987   Influenza Split 02/07/2011, 12/27/2011   Influenza,inj,Quad PF,6+ Mos 03/28/2014, 01/31/2020   Influenza-Unspecified 05/10/2019, 11/25/2020   MMR 04/05/1983, 09/07/2014   PFIZER(Purple Top)SARS-COV-2 Vaccination 10/26/2019, 01/31/2020   Pneumococcal Polysaccharide-23 08/15/2010, 06/27/2021   Tdap 04/08/2006   She had TdaP in 04/2019 through employee health at [facility]. She will get [place] the dates. Last Pap smear: 05/2020 normal (Dr. Seymour Bars) Last mammogram: never Last colonoscopy: never Last DEXA: never Dentist: 2x/year Ophtho: 2 years ago. She uses blue light glasses, which reduced her headaches. UPDATE  Exercise:  Walks dog 3x/week on a trail, 1.5 miles. Uses 5# dumbbells and does yoga 2x/week.  Lipid screen: Lab Results  Component Value Date   CHOL 165 06/27/2021   HDL  74 06/27/2021   LDLCALC 82 06/27/2021   TRIG 40 06/27/2021   CHOLHDL 2.2 06/27/2021     Last year PHQ-9 score was 5. 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 Feeling better now, found a more reliable job. Plans to start at [employer] soon.  UPDATE   PMH, PSH, SH and FH were reviewed and updated    ROS:   The patient denies anorexia, fever, weight changes, vision changes, decreased hearing, ear pain, sore throat, breast concerns, chest pain, palpitations, dizziness, syncope, dyspnea on exertion, cough, swelling, nausea, vomiting, diarrhea, constipation, abdominal pain, melena, hematochezia, indigestion/heartburn, hematuria, incontinence, dysuria, vaginal discharge, odor or itch, genital lesions, joint pains, numbness, tingling, weakness, tremor, suspicious skin lesions, depression, anxiety, abnormal bleeding/bruising, or enlarged lymph nodes. Infrequent heartburn.   Allergies Asthma--needing albuterol 3x/week when working. No further abnormal vaginal bleeding. Denies any vaginal discharge, itching. Moods are good    PHYSICAL EXAM:  There were no vitals taken for this visit.  Wt Readings from Last 3 Encounters:  03/11/22 198 lb 12.8 oz (90.2 kg)  10/03/21 205 lb 6.4 oz (93.2 kg)  07/31/21 202 lb 9.6 oz (91.9 kg)    General Appearance:    Alert, cooperative, no distress, appears stated age  Head:    Normocephalic, without obvious abnormality, atraumatic  Eyes:    PERRL, conjunctiva/corneas clear, EOM's intact, fundi benign  Ears:    Normal TM's and external ear canals  Nose:   Normal, no drainage  Throat:   Normal, no lesions  Neck:   Supple, no lymphadenopathy;  thyroid:  no enlargement/ tenderness/nodules; no carotid  bruit or JVD  Back:    Spine nontender, no curvature, ROM normal, no CVA     tenderness  Lungs:     Clear to auscultation bilaterally without wheezes, rales or     ronchi; respirations unlabored  Chest Wall:    No tenderness or deformity   Heart:    Regular rate and rhythm, S1 and S2 normal, no murmur, rub   or gallop  Breast Exam:    Deferred to GYN  Abdomen:     Soft, non-tender, nondistended, normoactive bowel sounds,    no masses, no hepatosplenomegaly  Genitalia:    Deferred to GYN     Extremities:   No clubbing, cyanosis or edema  Pulses:   2+ and symmetric  all extremities  Skin:   Skin color, texture, turgor normal, no rashes or lesions.  Limited exam, patient elected not to change into gown.   Lymph nodes:   Cervical, supraclavicular nodes normal  Neurologic:   Normal strength, sensation and gait; reflexes 2+ and symmetric throughout          Psych:   Normal mood, affect, hygiene and grooming.      ASSESSMENT/PLAN:  Need date of Tdap (employee health at [facility]?) Did she get flu shot? Offer/decline COVID booster  Mammo rec  C-met, CBC, TSH (ENSURE NOT TAKING BIOTIN) Lipids not needed unless change in diet D only if change in supplements  HCTZ rx will last until July.  Discussed monthly self breast exams and yearly mammograms after the age of 8; at least 30 minutes of aerobic activity at least 5 days/week, weight-bearing exercise at least 2x/week; proper sunscreen use reviewed; healthy diet, including goals of calcium and vitamin D intake and alcohol recommendations (less than or equal to 1 drink/day) reviewed; regular seatbelt use; changing batteries  in smoke detectors.  Immunization recommendations discussed--yearly flu shots recommended. Bivalent COVID vaccine recommended.   Will get [place] date of TdaP given by employee health.  Colonoscopy recommendations reviewed, age 69.   F/u 6 mos med check, 1 year CPE

## 2022-06-29 NOTE — Patient Instructions (Incomplete)
  HEALTH MAINTENANCE RECOMMENDATIONS:  It is recommended that you get at least 30 minutes of aerobic exercise at least 5 days/week (for weight loss, you may need as much as 60-90 minutes). This can be any activity that gets your heart rate up. This can be divided in 10-15 minute intervals if needed, but try and build up your endurance at least once a week.  Weight bearing exercise is also recommended twice weekly.  Eat a healthy diet with lots of vegetables, fruits and fiber.  "Colorful" foods have a lot of vitamins (ie green vegetables, tomatoes, red peppers, etc).  Limit sweet tea, regular sodas and alcoholic beverages, all of which has a lot of calories and sugar.  Up to 1 alcoholic drink daily may be beneficial for women (unless trying to lose weight, watch sugars).  Drink a lot of water.  Calcium recommendations are 1200-1500 mg daily (1500 mg for postmenopausal women or women without ovaries), and vitamin D 1000 IU daily.  This should be obtained from diet and/or supplements (vitamins), and calcium should not be taken all at once, but in divided doses.  Monthly self breast exams and yearly mammograms for women over the age of 3 is recommended.  Sunscreen of at least SPF 30 should be used on all sun-exposed parts of the skin when outside between the hours of 10 am and 4 pm (not just when at beach or pool, but even with exercise, golf, tennis, and yard work!)  Use a sunscreen that says "broad spectrum" so it covers both UVA and UVB rays, and make sure to reapply every 1-2 hours.  Remember to change the batteries in your smoke detectors when changing your clock times in the spring and fall. Carbon monoxide detectors are recommended for your home.  Use your seat belt every time you are in a car, and please drive safely and not be distracted with cell phones and texting while driving.  ADHD medications should not be used if potentially pregnant.  You should be sure that your blood pressure is well  controlled to take it. If you find that you would like to have some on hand (in the above scenario--BP okay, and not pregnant yet), then let us know which pharmacy to send the Adderall XR 5mg  prescription to.  Please get a flu shot yearly, in the fall.  Call the [facility] to schedule your first screening mammogram (but NOT if/when you are pregnant).  You need to cut back on sugary beverages in your diet. Drink more water. Consider Crystal Light if you need something different (has less sugar). Consider selzers (sugar-free), or water with lemon. Cut back on fried foods and carbs (potatoes, chips).  Cut back on the sodium in your diet. Restart your HCTZ (okay to start at the 1.5 tablets that you were taking in January which controlled your blood pressure well. If you blood pressure drops to <105/60 on a regular basis or if you have dizziness, then decrease the dose to just 1 tablet. Discuss your blood pressure with your OB-GYN next month.  This medication will need to be changed when you are pregnant.  Please monitor your blood pressure regularly.  Send [place] a list of you blood pressures within 2-3 weeks of restarting your medication.  Schedule a follow-up here within 2-3 months if you aren't pregnant.

## 2022-07-01 ENCOUNTER — Encounter: Payer: Self-pay | Admitting: Family Medicine

## 2022-07-01 ENCOUNTER — Ambulatory Visit (INDEPENDENT_AMBULATORY_CARE_PROVIDER_SITE_OTHER): Payer: No Typology Code available for payment source | Admitting: Family Medicine

## 2022-07-01 VITALS — BP 138/100 | HR 80 | Ht 65.0 in | Wt 215.6 lb

## 2022-07-01 DIAGNOSIS — R7989 Other specified abnormal findings of blood chemistry: Secondary | ICD-10-CM | POA: Diagnosis not present

## 2022-07-01 DIAGNOSIS — Z6835 Body mass index (BMI) 35.0-35.9, adult: Secondary | ICD-10-CM

## 2022-07-01 DIAGNOSIS — Z0000 Encounter for general adult medical examination without abnormal findings: Secondary | ICD-10-CM | POA: Diagnosis not present

## 2022-07-01 DIAGNOSIS — I10 Essential (primary) hypertension: Secondary | ICD-10-CM

## 2022-07-01 DIAGNOSIS — Z5181 Encounter for therapeutic drug level monitoring: Secondary | ICD-10-CM

## 2022-07-01 DIAGNOSIS — E559 Vitamin D deficiency, unspecified: Secondary | ICD-10-CM | POA: Diagnosis not present

## 2022-07-01 DIAGNOSIS — F988 Other specified behavioral and emotional disorders with onset usually occurring in childhood and adolescence: Secondary | ICD-10-CM

## 2022-07-01 LAB — POCT URINALYSIS DIP (PROADVANTAGE DEVICE)
Bilirubin, UA: NEGATIVE
Glucose, UA: NEGATIVE mg/dL
Ketones, POC UA: NEGATIVE mg/dL
Leukocytes, UA: NEGATIVE
Nitrite, UA: NEGATIVE
Protein Ur, POC: NEGATIVE mg/dL
Specific Gravity, Urine: 1.02
Urobilinogen, Ur: 0.2
pH, UA: 6 (ref 5.0–8.0)

## 2022-07-02 LAB — COMPREHENSIVE METABOLIC PANEL
ALT: 16 IU/L (ref 0–32)
AST: 21 IU/L (ref 0–40)
Albumin/Globulin Ratio: 1.6 (ref 1.2–2.2)
Albumin: 3.8 g/dL — ABNORMAL LOW (ref 3.9–4.9)
Alkaline Phosphatase: 66 IU/L (ref 44–121)
BUN/Creatinine Ratio: 11 (ref 9–23)
BUN: 11 mg/dL (ref 6–24)
Bilirubin Total: 0.2 mg/dL (ref 0.0–1.2)
CO2: 23 mmol/L (ref 20–29)
Calcium: 9.1 mg/dL (ref 8.7–10.2)
Chloride: 105 mmol/L (ref 96–106)
Creatinine, Ser: 1 mg/dL (ref 0.57–1.00)
Globulin, Total: 2.4 g/dL (ref 1.5–4.5)
Glucose: 76 mg/dL (ref 70–99)
Potassium: 4.6 mmol/L (ref 3.5–5.2)
Sodium: 139 mmol/L (ref 134–144)
Total Protein: 6.2 g/dL (ref 6.0–8.5)
eGFR: 73 mL/min/{1.73_m2} (ref >59)

## 2022-07-02 LAB — CBC WITH DIFFERENTIAL/PLATELET
Basophils Absolute: 0.1 10*3/uL (ref 0.0–0.2)
Basos: 1 %
EOS (ABSOLUTE): 0.7 10*3/uL — ABNORMAL HIGH (ref 0.0–0.4)
Eos: 11 %
Hematocrit: 35.4 % (ref 34.0–46.6)
Hemoglobin: 10.8 g/dL — ABNORMAL LOW (ref 11.1–15.9)
Immature Grans (Abs): 0 10*3/uL (ref 0.0–0.1)
Immature Granulocytes: 0 %
Lymphocytes Absolute: 1.9 10*3/uL (ref 0.7–3.1)
Lymphs: 29 %
MCH: 28.9 pg (ref 26.6–33.0)
MCHC: 30.5 g/dL — ABNORMAL LOW (ref 31.5–35.7)
MCV: 95 fL (ref 79–97)
Monocytes Absolute: 0.6 10*3/uL (ref 0.1–0.9)
Monocytes: 9 %
Neutrophils Absolute: 3.4 10*3/uL (ref 1.4–7.0)
Neutrophils: 50 %
Platelets: 318 10*3/uL (ref 150–450)
RBC: 3.74 x10E6/uL — ABNORMAL LOW (ref 3.77–5.28)
RDW: 14.5 % (ref 11.7–15.4)
WBC: 6.6 10*3/uL (ref 3.4–10.8)

## 2022-07-02 LAB — LIPID PANEL
Chol/HDL Ratio: 2.6 ratio (ref 0.0–4.4)
Cholesterol, Total: 168 mg/dL (ref 100–199)
HDL: 64 mg/dL (ref >39)
LDL Chol Calc (NIH): 85 mg/dL (ref 0–99)
Triglycerides: 104 mg/dL (ref 0–149)
VLDL Cholesterol Cal: 19 mg/dL (ref 5–40)

## 2022-07-02 LAB — TSH: TSH: 0.582 u[IU]/mL (ref 0.450–4.500)

## 2022-07-02 LAB — VITAMIN D 25 HYDROXY (VIT D DEFICIENCY, FRACTURES): Vit D, 25-Hydroxy: 52.7 ng/mL (ref 30.0–100.0)

## 2022-07-05 LAB — IRON: Iron: 53 ug/dL (ref 27–159)

## 2022-07-05 LAB — SPECIMEN STATUS REPORT

## 2022-07-05 LAB — FERRITIN: Ferritin: 11 ng/mL — ABNORMAL LOW (ref 15–150)

## 2022-07-05 LAB — B12 AND FOLATE PANEL
Folate: 10.2 ng/mL (ref >3.0)
Vitamin B-12: 797 pg/mL (ref 232–1245)

## 2022-07-11 ENCOUNTER — Telehealth: Payer: Self-pay | Admitting: Family Medicine

## 2022-07-11 DIAGNOSIS — F988 Other specified behavioral and emotional disorders with onset usually occurring in childhood and adolescence: Secondary | ICD-10-CM

## 2022-07-11 MED ORDER — AMPHETAMINE-DEXTROAMPHET ER 5 MG PO CP24: 5.0000 mg | ORAL_CAPSULE | Freq: Every day | ORAL | 0 refills | Status: AC

## 2022-07-11 NOTE — Telephone Encounter (Signed)
Prescription Request  07/11/2022  LOV: 07/01/2022  What is the name of the medication or equipment? Adderall xr 5 mg  Have you contacted your pharmacy to request a refill? Yes   Which pharmacy would you like this sent to?   [facility] - [place], [place] - [address]   Patient notified that their request is being sent to the clinical staff for review and that they should receive a response within 2 business days.   Please advise at Mobile [phone] (mobile)

## 2022-07-17 ENCOUNTER — Encounter: Payer: Self-pay | Admitting: *Deleted

## 2022-08-20 ENCOUNTER — Ambulatory Visit (INDEPENDENT_AMBULATORY_CARE_PROVIDER_SITE_OTHER): Payer: BC Managed Care – PPO | Admitting: Obstetrics & Gynecology

## 2022-08-20 ENCOUNTER — Other Ambulatory Visit (HOSPITAL_COMMUNITY)
Admission: RE | Admit: 2022-08-20 | Discharge: 2022-08-20 | Disposition: A | Discharge status: Home / Self Care | Payer: No Typology Code available for payment source | Source: Ambulatory Visit | Attending: Obstetrics & Gynecology | Admitting: Obstetrics & Gynecology

## 2022-08-20 ENCOUNTER — Encounter: Payer: Self-pay | Admitting: Obstetrics & Gynecology

## 2022-08-20 VITALS — BP 128/82 | HR 81 | Ht 65.5 in | Wt 221.0 lb

## 2022-08-20 DIAGNOSIS — Z3169 Encounter for other general counseling and advice on procreation: Secondary | ICD-10-CM

## 2022-08-20 DIAGNOSIS — Z01419 Encounter for gynecological examination (general) (routine) without abnormal findings: Secondary | ICD-10-CM | POA: Insufficient documentation

## 2022-08-20 NOTE — Progress Notes (Signed)
Tonya Perkins 17-May-1980 [id number]   History:    42 y.o. 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   RP:  Established patient presenting for annual gyn exam    HPI: Menses regular normal every month.  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  HSC/Myosure Excision/D+C on 07/31/21 Patho benign. No Pelvic pain.  Attempting conception.  Started on PNVs.  Pap Neg 05/2020.  Pap reflex today. Breasts normal. Will schedule Mammo.  Bowel movements and urine normal.  Body mass index 36.22.  Needs to exercise more. Health labs with family physician.   Past medical history,surgical history, family history and social history were all reviewed and documented in the EPIC chart.  Gynecologic History Patient's last menstrual period was 08/11/2022.  Obstetric History OB History  Gravida Para Term Preterm AB Living  0 0 0 0 0 0  SAB IAB Ectopic Multiple Live Births  0 0 0 0 0     ROS: A ROS was performed and pertinent positives and negatives are included in the history. GENERAL: No fevers or chills. HEENT: No change in vision, no earache, sore throat or sinus congestion. NECK: No pain or stiffness. CARDIOVASCULAR: No chest pain or pressure. No palpitations. PULMONARY: No shortness of breath, cough or wheeze. GASTROINTESTINAL: No abdominal pain, nausea, vomiting or diarrhea, melena or bright red blood per rectum. GENITOURINARY: No urinary frequency, urgency, hesitancy or dysuria. MUSCULOSKELETAL: No joint or muscle pain, no back pain, no recent trauma. DERMATOLOGIC: No rash, no itching, no lesions. ENDOCRINE: No polyuria, polydipsia, no heat or cold intolerance. No recent change in weight. HEMATOLOGICAL: No anemia or easy bruising or bleeding. NEUROLOGIC: No headache, seizures, numbness, tingling or weakness. PSYCHIATRIC: No depression, no loss of interest in normal activity or change in sleep pattern.     Exam:   BP 128/82 (BP Location: Right Arm, Patient Position: Sitting, Cuff Size: Normal)   Pulse 81    Ht 5' 5.5" (1.664 m)   Wt 221 lb (100.2 kg)   LMP 08/11/2022   SpO2 98%   BMI 36.22 kg/m   Body mass index is 36.22 kg/m.  General appearance : Well developed well nourished female. No acute distress HEENT: Eyes: no retinal hemorrhage or exudates,  Neck supple, trachea midline, no carotid bruits, no thyroidmegaly Lungs: Clear to auscultation, no rhonchi or wheezes, or rib retractions  Heart: Regular rate and rhythm, no murmurs or gallops Breast:Examined in sitting and supine position were symmetrical in appearance, no palpable masses or tenderness,  no skin retraction, no nipple inversion, no nipple discharge, no skin discoloration, no axillary or supraclavicular lymphadenopathy Abdomen: no palpable masses or tenderness, no rebound or guarding Extremities: no edema or skin discoloration or tenderness  Pelvic: Vulva: Normal             Vagina: No gross lesions or discharge  Cervix: No gross lesions or discharge.  Pap reflex done.  Uterus  AV, normal size, shape and consistency, non-tender and mobile  Adnexa  Without masses or tenderness  Anus: Normal   Assessment/Plan:  42 y.o. female for annual exam   1. Encounter for routine gynecological examination with Papanicolaou smear of cervix Menses regular normal every month.  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  HSC/Myosure Excision/D+C on 07/31/21 Patho benign. No Pelvic pain.  Attempting conception.  Started on PNVs.  Pap Neg 05/2020.  Pap reflex today. Breasts normal. Will schedule Mammo.  Bowel movements and urine normal.  Body mass index 36.22.  Needs  to exercise more. Health labs with family physician. - Cytology - PAP( [place])  2. Encounter for preconception consultation Attempting conception.  Started on PNVs.  AMA at 42 yo.  Menses regular normal. Will check AMH today.  If has not conceived after 6 months of timed IC, recommend consulting with Fertility, Dr. April Manson.  If successful, recommend Dr Noland Fordyce or Dr Shea Evans for [facility]. - Anti mullerian hormone   Genia Del MD, 4:40 PM

## 2022-08-22 ENCOUNTER — Ambulatory Visit: Payer: No Typology Code available for payment source | Admitting: Obstetrics & Gynecology

## 2022-08-23 LAB — ANTI-MULLERIAN HORMONE (AMH), FEMALE: Anti-Mullerian Hormones(AMH), Female: 3.33 ng/mL (ref 0.18–5.68)

## 2022-08-26 LAB — CYTOLOGY - PAP: Diagnosis: NEGATIVE

## 2022-08-28 ENCOUNTER — Telehealth: Payer: Self-pay

## 2022-08-28 DIAGNOSIS — N926 Irregular menstruation, unspecified: Secondary | ICD-10-CM

## 2022-08-28 NOTE — Telephone Encounter (Signed)
Pt LVM in triage line reporting cycle from 08/11/2022-08/15/2022 and having vaginal bleeding today.  LVMTCB.

## 2022-08-28 NOTE — Telephone Encounter (Signed)
Pt reports noticing some spotting that started last night and now is experiencing moderate bleeding as if new cycle is starting. Denies any pain/discomfort.   States the only thing she knows of that could be contributing is that she has been doing some heavy lifting of patient's lately.   Pt seen recently for visit, had AMH done and result is in. Desires to conceive. Recent hx of D&C in 2023 for AUB/Myoma.  Please advise.

## 2022-08-28 NOTE — Telephone Encounter (Signed)
Per ML: "AMH normal.  Recommend observation, could be ovulatory mid cycle spotting. Dr Seymour Bars"  Pt notified and voiced understanding. Will close encounter.

## 2022-09-02 NOTE — Telephone Encounter (Signed)
Pt calling today stating that bleeding did not lighten up or cease since speaking on 08/28/2022. States that it led to a more moderate flow as if normal cycle with bloating/cramping/clotting. States at one point, she did have a gush with standing but denies heaviness of flow as if soaking through pad/tampon within 1hr or less. Denies significant pain.   States she feels as if she has another fibroid. Come in for office visit for eval or send for imaging? Please advise.

## 2022-09-02 NOTE — Telephone Encounter (Signed)
Per ML: "Given that she is attempting conception, recommend to refer to Dr Jamse Arn.  In the meantime, organize an outside pelvic US to reevaluate.  CBC, Ferritin here. Dr Seymour Bars"  Pt notified and voiced understanding. [place] order placed and number provided to [facility] Centralized scheduling. Msg sent to referral coordinator to send referral to [facility]   Can you please confirm if you would like pt to schedule f/u appt after [place] to go over results and then have labs done then or prefer for pt to come in for labs ASAP and then we will address labs/US results once returned? TIA.

## 2022-09-02 NOTE — Addendum Note (Signed)
Addended by: Jodelle Red D on: 09/02/2022 03:03 PM   Modules accepted: Orders

## 2022-09-06 ENCOUNTER — Ambulatory Visit (HOSPITAL_COMMUNITY)
Admission: RE | Admit: 2022-09-06 | Discharge: 2022-09-06 | Disposition: A | Discharge status: Home / Self Care | Payer: No Typology Code available for payment source | Source: Ambulatory Visit | Attending: Obstetrics & Gynecology | Admitting: Obstetrics & Gynecology

## 2022-09-06 DIAGNOSIS — N926 Irregular menstruation, unspecified: Secondary | ICD-10-CM | POA: Insufficient documentation

## 2022-09-12 ENCOUNTER — Telehealth: Payer: Self-pay

## 2022-09-12 DIAGNOSIS — N926 Irregular menstruation, unspecified: Secondary | ICD-10-CM

## 2022-09-12 DIAGNOSIS — N922 Excessive menstruation at puberty: Secondary | ICD-10-CM

## 2022-09-12 NOTE — Telephone Encounter (Signed)
See result note 09/12/22 for recommendations.

## 2022-09-12 NOTE — Telephone Encounter (Signed)
[place] report is now available to review.

## 2022-09-12 NOTE — Telephone Encounter (Signed)
ML pt LVM in triage line requesting results from US performed at an external facility on 09/06/2022.   Results not yet received.  Request given to Ray @ [facility] radiology reception desk. He said he will give the request to have it read asap.   Pt notified that once results have been received, we will send to provider to review. Pt voiced understanding.

## 2022-09-13 MED ORDER — IBUPROFEN 800 MG PO TABS: 800.0000 mg | ORAL_TABLET | Freq: Three times a day (TID) | ORAL | 1 refills | Status: AC | PRN

## 2022-09-13 NOTE — Telephone Encounter (Signed)
Taking Ibuprofen 800 mg by mouth every 8 hours for the first two days of her menstrual cycle may reduce bleeding.   Disp:  30, RF:  one.

## 2022-09-13 NOTE — Telephone Encounter (Signed)
Pt notified and voiced understanding. Rx sent. Will route to provider for final review and close.

## 2022-09-13 NOTE — Telephone Encounter (Signed)
Pt notified and voiced understanding of results/recommendation per Dr. Edward Jolly. Referral has already been sent to [facility] and pt reports having an upcoming appt w/ them already scheduled.  However, pt is inquiring in the meantime if there is anything she can do to manage her heavy bleeding. States she is not having any today but if returns prior to her appt w/ [facility]. Please advise. Thanks.

## 2022-09-13 NOTE — Telephone Encounter (Signed)
[place] report faxed successfully to [facility].

## 2022-09-24 ENCOUNTER — Other Ambulatory Visit: Payer: Self-pay | Admitting: Obstetrics & Gynecology

## 2022-09-24 MED ORDER — VALACYCLOVIR HCL 500 MG PO TABS: ORAL_TABLET | ORAL | 0 refills | Status: AC

## 2022-09-24 NOTE — Telephone Encounter (Signed)
Med refill request: Valtrex Last AEX: 08/20/22 Next AEX: not scheduled Last MMG (if hormonal med) n/a Refill authorized: Please Advise, #90, 0 RF

## 2022-10-02 ENCOUNTER — Encounter: Payer: No Typology Code available for payment source | Admitting: Family Medicine

## 2022-10-09 DIAGNOSIS — D251 Intramural leiomyoma of uterus: Secondary | ICD-10-CM | POA: Diagnosis not present

## 2022-10-09 DIAGNOSIS — N979 Female infertility, unspecified: Secondary | ICD-10-CM | POA: Diagnosis not present

## 2022-10-09 DIAGNOSIS — E559 Vitamin D deficiency, unspecified: Secondary | ICD-10-CM | POA: Diagnosis not present

## 2022-10-09 DIAGNOSIS — D250 Submucous leiomyoma of uterus: Secondary | ICD-10-CM | POA: Diagnosis not present

## 2022-10-09 DIAGNOSIS — Z319 Encounter for procreative management, unspecified: Secondary | ICD-10-CM | POA: Diagnosis not present

## 2022-10-11 DIAGNOSIS — D252 Subserosal leiomyoma of uterus: Secondary | ICD-10-CM | POA: Diagnosis not present

## 2022-10-11 DIAGNOSIS — N979 Female infertility, unspecified: Secondary | ICD-10-CM | POA: Diagnosis not present

## 2022-10-11 DIAGNOSIS — N924 Excessive bleeding in the premenopausal period: Secondary | ICD-10-CM | POA: Diagnosis not present

## 2022-10-11 DIAGNOSIS — D250 Submucous leiomyoma of uterus: Secondary | ICD-10-CM | POA: Diagnosis not present

## 2022-10-11 DIAGNOSIS — E282 Polycystic ovarian syndrome: Secondary | ICD-10-CM | POA: Diagnosis not present

## 2022-10-29 DIAGNOSIS — E282 Polycystic ovarian syndrome: Secondary | ICD-10-CM | POA: Diagnosis not present

## 2022-10-29 DIAGNOSIS — N924 Excessive bleeding in the premenopausal period: Secondary | ICD-10-CM | POA: Diagnosis not present

## 2022-10-29 DIAGNOSIS — Z3141 Encounter for fertility testing: Secondary | ICD-10-CM | POA: Diagnosis not present

## 2022-10-29 DIAGNOSIS — N979 Female infertility, unspecified: Secondary | ICD-10-CM | POA: Diagnosis not present

## 2022-10-29 DIAGNOSIS — Z3202 Encounter for pregnancy test, result negative: Secondary | ICD-10-CM | POA: Diagnosis not present

## 2022-10-29 DIAGNOSIS — D252 Subserosal leiomyoma of uterus: Secondary | ICD-10-CM | POA: Diagnosis not present

## 2022-10-29 DIAGNOSIS — Z319 Encounter for procreative management, unspecified: Secondary | ICD-10-CM | POA: Diagnosis not present

## 2022-10-29 DIAGNOSIS — D250 Submucous leiomyoma of uterus: Secondary | ICD-10-CM | POA: Diagnosis not present

## 2022-11-20 DIAGNOSIS — Z23 Encounter for immunization: Secondary | ICD-10-CM | POA: Diagnosis not present

## 2022-12-02 DIAGNOSIS — D251 Intramural leiomyoma of uterus: Secondary | ICD-10-CM | POA: Diagnosis not present

## 2022-12-02 DIAGNOSIS — D250 Submucous leiomyoma of uterus: Secondary | ICD-10-CM | POA: Diagnosis not present

## 2022-12-04 ENCOUNTER — Encounter (HOSPITAL_BASED_OUTPATIENT_CLINIC_OR_DEPARTMENT_OTHER): Payer: Self-pay | Admitting: Obstetrics and Gynecology

## 2022-12-04 ENCOUNTER — Other Ambulatory Visit: Payer: Self-pay | Admitting: Obstetrics and Gynecology

## 2022-12-04 ENCOUNTER — Telehealth: Payer: Self-pay | Admitting: *Deleted

## 2022-12-04 NOTE — Telephone Encounter (Signed)
Patient left message requesting return call. States she was f/u from [facility] consult.   Left message to call [facility] Triage at [phone], option 4.

## 2022-12-05 ENCOUNTER — Encounter (HOSPITAL_BASED_OUTPATIENT_CLINIC_OR_DEPARTMENT_OTHER): Payer: Self-pay | Admitting: Obstetrics and Gynecology

## 2022-12-05 NOTE — Progress Notes (Addendum)
Addendum:   on 10/15 received call from patient stated she was returning call back about new time change for surgery tomorrow. Pt verbalized understanding to arrive at 1200 , npo after mn w/ exception clear liquids until 1100.  Spoke w/ via phone for pre-op interview--- pt Lab needs dos----   cbc, t&s, istat, EKG, urine preg      Lab results------ no COVID test -----patient states asymptomatic no test needed Arrive at ------- 1400 on 12-11-2022 NPO after MN NO Solid Food.  Clear liquids from MN until--- 1300 Med rec completed Medications to take morning of surgery ----- breo inhaler, xyzal Diabetic medication ----- n/a Patient instructed no nail polish to be worn day of surgery Patient instructed to bring photo id and insurance card day of surgery Patient aware to have Driver (ride ) / caregiver    for 24 hours after surgery - mother, diane Patient Special Instructions ----- asked to bring rescue inhaler dos Pre-Op special Instructions ----- n/a Patient verbalized understanding of instructions that were given at this phone interview. Patient denies chest pain, sob, fever, cough at the interview.

## 2022-12-11 ENCOUNTER — Encounter (HOSPITAL_BASED_OUTPATIENT_CLINIC_OR_DEPARTMENT_OTHER): Payer: Self-pay | Admitting: Obstetrics and Gynecology

## 2022-12-11 ENCOUNTER — Ambulatory Visit (HOSPITAL_BASED_OUTPATIENT_CLINIC_OR_DEPARTMENT_OTHER): Payer: BC Managed Care – PPO | Admitting: Anesthesiology

## 2022-12-11 ENCOUNTER — Other Ambulatory Visit: Payer: Self-pay

## 2022-12-11 ENCOUNTER — Ambulatory Visit (HOSPITAL_BASED_OUTPATIENT_CLINIC_OR_DEPARTMENT_OTHER)
Admission: RE | Admit: 2022-12-11 | Discharge: 2022-12-11 | Disposition: A | Discharge status: Home / Self Care | Payer: BC Managed Care – PPO | Attending: Obstetrics and Gynecology | Admitting: Obstetrics and Gynecology

## 2022-12-11 ENCOUNTER — Encounter (HOSPITAL_BASED_OUTPATIENT_CLINIC_OR_DEPARTMENT_OTHER)
Admission: RE | Disposition: A | Discharge status: Home / Self Care | Payer: Self-pay | Source: Home / Self Care | Attending: Obstetrics and Gynecology

## 2022-12-11 DIAGNOSIS — D250 Submucous leiomyoma of uterus: Secondary | ICD-10-CM | POA: Diagnosis not present

## 2022-12-11 DIAGNOSIS — D251 Intramural leiomyoma of uterus: Secondary | ICD-10-CM | POA: Diagnosis not present

## 2022-12-11 DIAGNOSIS — Z87891 Personal history of nicotine dependence: Secondary | ICD-10-CM | POA: Insufficient documentation

## 2022-12-11 DIAGNOSIS — J4540 Moderate persistent asthma, uncomplicated: Secondary | ICD-10-CM | POA: Insufficient documentation

## 2022-12-11 DIAGNOSIS — Z01818 Encounter for other preprocedural examination: Secondary | ICD-10-CM

## 2022-12-11 DIAGNOSIS — N979 Female infertility, unspecified: Secondary | ICD-10-CM | POA: Diagnosis not present

## 2022-12-11 DIAGNOSIS — N920 Excessive and frequent menstruation with regular cycle: Secondary | ICD-10-CM | POA: Insufficient documentation

## 2022-12-11 HISTORY — DX: Moderate persistent asthma, uncomplicated: J45.40

## 2022-12-11 HISTORY — PX: HYSTEROSCOPY: SHX211

## 2022-12-11 HISTORY — DX: Other allergy status, other than to drugs and biological substances: Z91.09

## 2022-12-11 HISTORY — DX: Iron deficiency anemia, unspecified: D50.9

## 2022-12-11 HISTORY — DX: Other bursitis of hip, right hip: M70.71

## 2022-12-11 HISTORY — DX: Migraine, unspecified, not intractable, without status migrainosus: G43.909

## 2022-12-11 HISTORY — DX: Female infertility, unspecified: N97.9

## 2022-12-11 HISTORY — DX: Excessive and frequent menstruation with regular cycle: N92.0

## 2022-12-11 HISTORY — DX: Other seasonal allergic rhinitis: J30.2

## 2022-12-11 HISTORY — DX: Intramural leiomyoma of uterus: D25.1

## 2022-12-11 HISTORY — PX: DILATATION & CURETTAGE/HYSTEROSCOPY WITH MYOSURE: SHX6511

## 2022-12-11 LAB — TYPE AND SCREEN
ABO/RH(D): O POS
Antibody Screen: NEGATIVE

## 2022-12-11 LAB — POCT PREGNANCY, URINE: Preg Test, Ur: NEGATIVE

## 2022-12-11 LAB — ABO/RH: ABO/RH(D): O POS

## 2022-12-11 SURGERY — HYSTEROSCOPY
Anesthesia: General | Site: Uterus

## 2022-12-11 MED ORDER — FENTANYL CITRATE (PF) 100 MCG/2ML IJ SOLN
INTRAMUSCULAR | Status: AC
  Filled 2022-12-11: qty 2

## 2022-12-11 MED ORDER — CEFAZOLIN SODIUM-DEXTROSE 2-4 GM/100ML-% IV SOLN
INTRAVENOUS | Status: AC
  Filled 2022-12-11: qty 100

## 2022-12-11 MED ORDER — POVIDONE-IODINE 10 % EX SWAB: 2.0000 | Freq: Once | CUTANEOUS | Status: DC

## 2022-12-11 MED ORDER — LACTATED RINGERS IV SOLN: INTRAVENOUS | Status: DC

## 2022-12-11 MED ORDER — MIDAZOLAM HCL 2 MG/2ML IJ SOLN
INTRAMUSCULAR | Status: AC
  Filled 2022-12-11: qty 2

## 2022-12-11 MED ORDER — PROPOFOL 10 MG/ML IV BOLUS
INTRAVENOUS | Status: DC | PRN
  Administered 2022-12-11: 200 mg via INTRAVENOUS

## 2022-12-11 MED ORDER — ACETAMINOPHEN 500 MG PO TABS
1000.0000 mg | ORAL_TABLET | Freq: Once | ORAL | Status: AC
  Administered 2022-12-11: 1000 mg via ORAL

## 2022-12-11 MED ORDER — ONDANSETRON HCL 4 MG/2ML IJ SOLN
INTRAMUSCULAR | Status: DC | PRN
  Administered 2022-12-11: 4 mg via INTRAVENOUS

## 2022-12-11 MED ORDER — DEXMEDETOMIDINE HCL IN NACL 80 MCG/20ML IV SOLN
INTRAVENOUS | Status: DC | PRN
  Administered 2022-12-11: 8 ug via INTRAVENOUS

## 2022-12-11 MED ORDER — OXYCODONE HCL 5 MG PO TABS
ORAL_TABLET | ORAL | Status: AC
  Filled 2022-12-11: qty 1

## 2022-12-11 MED ORDER — FENTANYL CITRATE (PF) 100 MCG/2ML IJ SOLN
25.0000 ug | INTRAMUSCULAR | Status: DC | PRN
  Administered 2022-12-11: 25 ug via INTRAVENOUS

## 2022-12-11 MED ORDER — LIDOCAINE HCL (CARDIAC) PF 100 MG/5ML IV SOSY
PREFILLED_SYRINGE | INTRAVENOUS | Status: DC | PRN
  Administered 2022-12-11 (×2): 50 mg via INTRAVENOUS

## 2022-12-11 MED ORDER — OXYCODONE HCL 5 MG PO TABS
5.0000 mg | ORAL_TABLET | Freq: Once | ORAL | Status: AC
  Administered 2022-12-11: 5 mg via ORAL

## 2022-12-11 MED ORDER — TRAMADOL HCL 50 MG PO TABS
50.0000 mg | ORAL_TABLET | Freq: Four times a day (QID) | ORAL | 0 refills | Status: DC | PRN
Start: 2022-12-11 — End: 2023-04-16

## 2022-12-11 MED ORDER — CEFAZOLIN SODIUM-DEXTROSE 2-4 GM/100ML-% IV SOLN
2.0000 g | Freq: Once | INTRAVENOUS | Status: AC
  Administered 2022-12-11: 2 g via INTRAVENOUS

## 2022-12-11 MED ORDER — VASOPRESSIN 20 UNIT/ML IV SOLN
INTRAVENOUS | Status: DC | PRN
  Administered 2022-12-11 (×2): 8 mL via INTRAMUSCULAR

## 2022-12-11 MED ORDER — KETOROLAC TROMETHAMINE 30 MG/ML IJ SOLN
INTRAMUSCULAR | Status: DC | PRN
Start: 2022-12-11 — End: 2022-12-11
  Administered 2022-12-11: 30 mg via INTRAVENOUS

## 2022-12-11 MED ORDER — DEXAMETHASONE SODIUM PHOSPHATE 4 MG/ML IJ SOLN
INTRAMUSCULAR | Status: DC | PRN
  Administered 2022-12-11: 5 mg via INTRAVENOUS

## 2022-12-11 MED ORDER — SODIUM CHLORIDE 0.9 % IR SOLN
Status: DC | PRN
  Administered 2022-12-11 (×2): 3000 mL

## 2022-12-11 MED ORDER — ACETAMINOPHEN 500 MG PO TABS
ORAL_TABLET | ORAL | Status: AC
  Filled 2022-12-11: qty 2

## 2022-12-11 MED ORDER — MIDAZOLAM HCL 2 MG/2ML IJ SOLN
INTRAMUSCULAR | Status: DC | PRN
  Administered 2022-12-11: 2 mg via INTRAVENOUS

## 2022-12-11 MED ORDER — BUPIVACAINE HCL (PF) 0.25 % IJ SOLN
INTRAMUSCULAR | Status: DC | PRN
Start: 2022-12-11 — End: 2022-12-11
  Administered 2022-12-11: 10 mL

## 2022-12-11 MED ORDER — FENTANYL CITRATE (PF) 100 MCG/2ML IJ SOLN
INTRAMUSCULAR | Status: DC | PRN
  Administered 2022-12-11: 25 ug via INTRAVENOUS
  Administered 2022-12-11: 50 ug via INTRAVENOUS
  Administered 2022-12-11: 25 ug via INTRAVENOUS
  Administered 2022-12-11 (×4): 50 ug via INTRAVENOUS

## 2022-12-11 SURGICAL SUPPLY — 29 items
BRR ADH 6X5 SEPRAFILM 1 SHT (MISCELLANEOUS)
CANNULA CURETTE W/SYR 6 (CANNULA) IMPLANT
CANNULA CURETTE W/SYR 7 (CANNULA) IMPLANT
CATH NOVY CORNUAL CURVED 5.0 (CATHETERS) IMPLANT
CATH ROBINSON RED A/P 16FR (CATHETERS) ×3 IMPLANT
DILATOR CANAL MILEX (MISCELLANEOUS) IMPLANT
DRAPE C-ARM 42X120 X-RAY (DRAPES) IMPLANT
DRSG TELFA 3X8 NADH STRL (GAUZE/BANDAGES/DRESSINGS) ×3 IMPLANT
ELECT BIPOLAR KNIFE NDL PTD 7M (ELECTRODE) IMPLANT
GAUZE 4X4 16PLY ~~LOC~~+RFID DBL (SPONGE) ×6 IMPLANT
GLOVE BIO SURGEON STRL SZ8 (GLOVE) ×3 IMPLANT
GLOVE BIOGEL PI IND STRL 6 (GLOVE) IMPLANT
GOWN STRL REUS W/TWL LRG LVL3 (GOWN DISPOSABLE) IMPLANT
GOWN STRL REUS W/TWL XL LVL3 (GOWN DISPOSABLE) ×6 IMPLANT
IV NS IRRIG 3000ML ARTHROMATIC (IV SOLUTION) ×3 IMPLANT
KIT PROCEDURE FLUENT (KITS) ×3 IMPLANT
KIT TURNOVER CYSTO (KITS) ×3 IMPLANT
LOOP CUTTING BIPOLAR 21FR (ELECTRODE) IMPLANT
MYOSURE XL FIBROID (MISCELLANEOUS) ×2
PACK VAGINAL MINOR WOMEN LF (CUSTOM PROCEDURE TRAY) ×3 IMPLANT
PAD OB MATERNITY 4.3X12.25 (PERSONAL CARE ITEMS) ×3 IMPLANT
SEAL ROD LENS SCOPE MYOSURE (ABLATOR) ×3 IMPLANT
SEPRAFILM MEMBRANE 5X6 (MISCELLANEOUS) IMPLANT
SET IRRIG Y TYPE TUR BLADDER L (SET/KITS/TRAYS/PACK) IMPLANT
SLEEVE SCD COMPRESS KNEE MED (STOCKING) ×3 IMPLANT
SYRINGE 3CC/18X1.5 ECLIPSE (MISCELLANEOUS) ×3 IMPLANT
SYSTEM TISS REMOVAL MYOSURE XL (MISCELLANEOUS) IMPLANT
TOWEL OR 17X24 6PK STRL BLUE (TOWEL DISPOSABLE) ×6 IMPLANT
TUBE CONNECTING 12X1/4 (SUCTIONS) IMPLANT

## 2022-12-11 NOTE — Anesthesia Postprocedure Evaluation (Signed)
Anesthesia Post Note  Patient: Tonya Perkins  Procedure(s) Performed: HYSTEROSCOPY Myomectomy (Uterus) DILATATION & CURETTAGE/HYSTEROSCOPY WITH MYOSURE (Uterus)     Patient location during evaluation: PACU Anesthesia Type: General Level of consciousness: awake and alert Pain management: pain level controlled Vital Signs Assessment: post-procedure vital signs reviewed and stable Respiratory status: spontaneous breathing, nonlabored ventilation and respiratory function stable Cardiovascular status: blood pressure returned to baseline and stable Postop Assessment: no apparent nausea or vomiting Anesthetic complications: no   No notable events documented.  Last Vitals:  Vitals:   12/11/22 1745 12/11/22 1800  BP: (!) 163/114 (!) 138/106  Pulse: 80 77  Resp: 20 12  Temp:  36.6 C  SpO2: 100% 100%    Last Pain:  Vitals:   12/11/22 1800  PainSc: 2                  Malka Bocek A.

## 2022-12-11 NOTE — Transfer of Care (Signed)
Immediate Anesthesia Transfer of Care Note  Patient: Tonya Perkins  Procedure(s) Performed: Procedure(s) (LRB): HYSTEROSCOPY Myomectomy (N/A) DILATATION & CURETTAGE/HYSTEROSCOPY WITH MYOSURE  Patient Location: PACU  Anesthesia Type: GA  Level of Consciousness: awake, sedated, patient cooperative and responds to stimulation  Airway & Oxygen Therapy: Patient Spontanous Breathing and Patient connected to [place] oxygen  Post-op Assessment: Report given to PACU RN, Post -op Vital signs reviewed and stable and Patient moving all extremities  Post vital signs: Reviewed and stable  Complications: No apparent anesthesia complications
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
## 2022-12-11 NOTE — Anesthesia Procedure Notes (Signed)
Procedure Name: LMA Insertion Date/Time: 12/11/2022 3:52 PM  Performed by: Earmon Phoenix, CRNAPre-anesthesia Checklist: Timeout performed, Patient identified, Emergency Drugs available, Suction available and Patient being monitored Patient Re-evaluated:Patient Re-evaluated prior to induction Oxygen Delivery Method: Circle System Utilized Preoxygenation: Pre-oxygenation with 100% oxygen Induction Type: IV induction Ventilation: Mask ventilation without difficulty LMA: LMA inserted LMA Size: 4.0 Number of attempts: 1 Placement Confirmation: positive ETCO2, breath sounds checked- equal and bilateral and CO2 detector Comments: LMA placed by Donia Ast CRNA

## 2022-12-11 NOTE — Discharge Instructions (Addendum)
No acetaminophen/Tylenol until after 6:30 pm today if needed for pain.   No ibuprofen, Advil, Aleve, Motrin, ketorolac, meloxicam, naproxen, or other NSAIDS until after 10:30pm today if needed for pain.    Post Anesthesia Home Care Instructions  Activity: Get plenty of rest for the remainder of the day. A responsible individual must stay with you for 24 hours following the procedure.  For the next 24 hours, DO NOT: -Drive a car -Advertising copywriter -Drink alcoholic beverages -Take any medication unless instructed by your physician -Make any legal decisions or sign important papers.  Meals: Start with liquid foods such as gelatin or soup. Progress to regular foods as tolerated. Avoid greasy, spicy, heavy foods. If nausea and/or vomiting occur, drink only clear liquids until the nausea and/or vomiting subsides. Call your physician if vomiting continues.  Special Instructions/Symptoms: Your throat may feel dry or sore from the anesthesia or the breathing tube placed in your throat during surgery. If this causes discomfort, gargle with warm salt water. The discomfort should disappear within 24 hours.

## 2022-12-11 NOTE — Anesthesia Preprocedure Evaluation (Addendum)
Anesthesia Evaluation  Patient identified by MRN, date of birth, ID band Patient awake    Reviewed: Allergy & Precautions, NPO status , Patient's Chart, lab work & pertinent test results  Airway Mallampati: II  TM Distance: >3 FB Neck ROM: Full    Dental  (+) Teeth Intact, Dental Advisory Given   Pulmonary asthma , former smoker   Pulmonary exam normal breath sounds clear to auscultation       Cardiovascular negative cardio ROS Normal cardiovascular exam Rhythm:Regular Rate:Normal     Neuro/Psych  Headaches  negative psych ROS   GI/Hepatic Neg liver ROS,GERD  ,,  Endo/Other  negative endocrine ROS    Renal/GU negative Renal ROS  negative genitourinary   Musculoskeletal negative musculoskeletal ROS (+)    Abdominal  (+) + obese  Peds  (+) ATTENTION DEFICIT DISORDER WITHOUT HYPERACTIVITY Hematology negative hematology ROS (+)   Anesthesia Other Findings   Reproductive/Obstetrics                             Anesthesia Physical Anesthesia Plan  ASA: 2  Anesthesia Plan: General   Post-op Pain Management: Tylenol PO (pre-op)* and Ketamine IV*   Induction: Intravenous  PONV Risk Score and Plan: 3 and Midazolam, Dexamethasone and Ondansetron  Airway Management Planned: Oral ETT  Additional Equipment:   Intra-op Plan:   Post-operative Plan: Extubation in OR  Informed Consent: I have reviewed the patients History and Physical, chart, labs and discussed the procedure including the risks, benefits and alternatives for the proposed anesthesia with the patient or authorized representative who has indicated his/her understanding and acceptance.     Dental advisory given  Plan Discussed with: CRNA and Anesthesiologist  Anesthesia Plan Comments:        Anesthesia Quick Evaluation

## 2022-12-11 NOTE — Op Note (Signed)
OPERATIVE NOTE  Preoperative diagnosis: Type I large (2.5 cm) submucosal myoma, menorrhagia  Postoperative diagnosis: The same  Procedure: Hysteroscopy, bipolar electrosurgical and MyoSure resection of myoma  Surgeon: Fermin Schwab  Anesthesia: General  Complications: None  Estimated blood loss: 100 mL  Hysteroscopic fluid deficit: 200 mL  Specimen: Myoma pieces to pathology  Findings: Endocervical canal appeared normal. The uterus sounded to 9 cm. Endometrial cavity had 2.5 cm type 0 (intracavitary) myoma with fundal attachment. Both tubal ostia were seen.  Description of procedure: Patient was placed in dorsal supine position. General anesthesia was administered. She was placed in lithotomy position. She was prepped and draped in sterile manner. A vaginal speculum was placed. A dilute vasopressin solution containing 0.33 units per milliliter was injected into the cervical stroma x5 cc.  Distention medium was saline.  A 24 French bipolar resectoscope was inserted and video hysteroscopy was started.  Myoma was dissected in long strips and the pieces were scooped out.  Distention method was a hysteroscopic pump set at 90 mm mercury. Above findings were noted.  We dilated the cervix to 29 [place] with Pratt dilators and try to grasp the remainder of the myoma but this was not successful and visualization was difficult because of the loose tissue surrounding the myoma remnant therefore we switched to a MyoSure XL blade and resumed the procedure.  Finally resection of the stalk of the myoma was confirmed but it was still difficult to reduce the free-floating myoma with the MyoSure.  Therefore we used Hegar dilators and dilated the cervix to 11 mm and inserted Schweitzer forceps to extract the rest of the myoma specimen.  Hysteroscopy was repeated to ensure that the myoma was adequately resected. Hemostasis was insured. Instrument count was correct. Estimated blood loss was 100 mL.  Hysteroscopic  fluid deficit was 200 mL.  The patient tolerated the procedure well and was transferred to recovery in satisfactory condition.   Fermin Schwab, MD

## 2022-12-13 ENCOUNTER — Encounter (HOSPITAL_BASED_OUTPATIENT_CLINIC_OR_DEPARTMENT_OTHER): Payer: Self-pay | Admitting: Obstetrics and Gynecology

## 2022-12-13 LAB — SURGICAL PATHOLOGY

## 2022-12-31 NOTE — Telephone Encounter (Signed)
Spoke w/ pt and she stated that she had myomectomy on 12/11/2022 and that went well, just had her post op.  Mainly wanted to inquire whom she should continue her GYN care with.   Pt advised that was her choice. Reminded her that she had seen TW in the past but advised her that we still had BS in office as well as JC,NP and acquired two new physician's Dr. Kennith Center and Dr. Karma Greaser.   Advised not due for AEX until 07/2023 but if an OV is needed in the mean time, she can call and schedule with whomever she desires.  Pt voiced understanding and appreciation for cb.   Routing to provider for final review and closing encounter.

## 2023-01-13 DIAGNOSIS — Z3189 Encounter for other procreative management: Secondary | ICD-10-CM | POA: Diagnosis not present

## 2023-01-14 DIAGNOSIS — Z3189 Encounter for other procreative management: Secondary | ICD-10-CM | POA: Diagnosis not present

## 2023-03-11 ENCOUNTER — Other Ambulatory Visit: Payer: Self-pay | Admitting: Family Medicine

## 2023-03-11 NOTE — Telephone Encounter (Signed)
 Decline. She cancelled her med check for August, and has no f/u scheduled. She is under the care of an allergist (at least she used to be), so can request albuterol refill from them. She needs to schedule med check, and CPE is due in May, not scheduled.

## 2023-03-11 NOTE — Telephone Encounter (Signed)
 Left message for patient

## 2023-03-14 ENCOUNTER — Telehealth: Payer: Self-pay

## 2023-03-14 ENCOUNTER — Encounter: Payer: Self-pay | Admitting: Obstetrics and Gynecology

## 2023-03-14 ENCOUNTER — Ambulatory Visit (INDEPENDENT_AMBULATORY_CARE_PROVIDER_SITE_OTHER): Payer: No Typology Code available for payment source | Admitting: Obstetrics and Gynecology

## 2023-03-14 VITALS — BP 118/80 | HR 83

## 2023-03-14 DIAGNOSIS — N6312 Unspecified lump in the right breast, upper inner quadrant: Secondary | ICD-10-CM | POA: Diagnosis not present

## 2023-03-14 NOTE — Progress Notes (Signed)
   Acute Office Visit  Subjective:    Patient ID: Tonya Perkins, female    DOB: 1981/01/07, 43 y.o.   MRN: [id number]   HPI 43 y.o. presents today for breast lump (Rt breast lump//jj) . Right breast at 9 oclock No family history of breast cancer No discharge or skin changes Has not started mammogram screening Patient's last menstrual period was 03/10/2023 (exact date). Period Duration (Days): 5 Period Pattern: Regular Menstrual Flow: Heavy Menstrual Control: Maxi pad Dysmenorrhea: (!) Moderate Dysmenorrhea Symptoms: Cramping, Headache, Nausea  Review of Systems     Objective:    Physical Exam Chest:     Comments: Right breast <1cm mobile mass at 9 no skin retraction, No discharge Normal skin Left breast normal    BP 118/80   Pulse 83   LMP 03/10/2023 (Exact Date)   SpO2 97%  Wt Readings from Last 3 Encounters:  12/05/22 206 lb (93.4 kg)  08/20/22 221 lb (100.2 kg)  07/01/22 215 lb 9.6 oz (97.8 kg)        Patient informed chaperone available to be present for breast and/or pelvic exam. Patient has requested no chaperone to be present. Patient has been advised what will be completed during breast and pelvic exam.   Assessment & Plan:  Right breast mass: referral for diagnostic mammogram and ultrasound placed.  Earley Favor

## 2023-03-14 NOTE — Telephone Encounter (Signed)
Pt LVM in triage line stating that she just got off the phone w/ [facility] and was told that they still need an order prior to scheduling her.   Imaging for rt breast lump needed per Dr. Bonney Roussel office note and also needs screening. Order already placed for b/l dx mammo but also needs [place] order for rt breast.   Order placed and pt was notified to call and schedule.

## 2023-03-20 NOTE — Telephone Encounter (Signed)
Per imaging ctr's scheduling notes:  "03/14/23- 1ST ATTMEPT/ LMOM/ [facility]"

## 2023-04-01 ENCOUNTER — Ambulatory Visit
Admission: RE | Admit: 2023-04-01 | Discharge: 2023-04-01 | Disposition: A | Discharge status: Home / Self Care | Payer: BC Managed Care – PPO | Source: Ambulatory Visit | Attending: Obstetrics and Gynecology | Admitting: Obstetrics and Gynecology

## 2023-04-01 ENCOUNTER — Encounter: Payer: Self-pay | Admitting: Obstetrics and Gynecology

## 2023-04-01 ENCOUNTER — Ambulatory Visit
Admission: RE | Admit: 2023-04-01 | Discharge: 2023-04-01 | Disposition: A | Discharge status: Home / Self Care | Payer: BC Managed Care – PPO | Source: Ambulatory Visit | Attending: Obstetrics and Gynecology

## 2023-04-01 DIAGNOSIS — N6001 Solitary cyst of right breast: Secondary | ICD-10-CM | POA: Diagnosis not present

## 2023-04-01 DIAGNOSIS — N6312 Unspecified lump in the right breast, upper inner quadrant: Secondary | ICD-10-CM

## 2023-04-01 DIAGNOSIS — N6311 Unspecified lump in the right breast, upper outer quadrant: Secondary | ICD-10-CM | POA: Diagnosis not present

## 2023-04-03 NOTE — Telephone Encounter (Signed)
 Pt had imaging performed on 04/01/2023. Encounter closed.

## 2023-04-04 ENCOUNTER — Telehealth: Payer: Self-pay

## 2023-04-04 NOTE — Telephone Encounter (Signed)
 Patient called & left message on triage voicemail. She said she was having symptoms of yeast & took 1 diflucan  & this morning. She does have some relief but not 100% better yet. Patient asking if another refill could be called in for her or if she needed an appointment. I called patient back & got her voicemail. I asked patient to call me back so I could get more information from her. I let patient know the phones go off at 1:30 today & that if symptoms get worse for her to go to urgent care to be seen.

## 2023-04-04 NOTE — Telephone Encounter (Signed)
 Patient states she is having some minor itching around her vulva. No burning or discharge. Patient states she took a diflucan  around or 4 or 5 o'clock this morning. She states she has some relief. Patient is aware that the diflucan  is still working for about 72hrs. Patient advised she can use some otc monistat creams if needed or go to urgent care if symptoms get worse. If symptoms stay the same then she will call next week for an appointment. Patient asked if 1 diflucan  could get sent in for her incase needed. Patient aware I will route to a provider

## 2023-04-06 NOTE — Telephone Encounter (Signed)
 This is Dr. Colvin Dec covering for Dr. Tia Flowers who is currently out of the office.   Please schedule office visit if patient is having continued symptoms.   No additional Diflucan  at this time.

## 2023-04-07 NOTE — Telephone Encounter (Signed)
 Pt notified and voiced understanding. Stated she is doing better now and will cb if appt is needed.   Encounter closed.

## 2023-04-16 ENCOUNTER — Ambulatory Visit: Payer: Self-pay | Admitting: Family Medicine

## 2023-04-16 ENCOUNTER — Telehealth (INDEPENDENT_AMBULATORY_CARE_PROVIDER_SITE_OTHER): Payer: No Typology Code available for payment source | Admitting: Family Medicine

## 2023-04-16 ENCOUNTER — Encounter: Payer: Self-pay | Admitting: *Deleted

## 2023-04-16 ENCOUNTER — Encounter: Payer: Self-pay | Admitting: Family Medicine

## 2023-04-16 VITALS — BP 146/75 | Ht 65.5 in

## 2023-04-16 DIAGNOSIS — J019 Acute sinusitis, unspecified: Secondary | ICD-10-CM | POA: Diagnosis not present

## 2023-04-16 DIAGNOSIS — J4531 Mild persistent asthma with (acute) exacerbation: Secondary | ICD-10-CM | POA: Diagnosis not present

## 2023-04-16 DIAGNOSIS — I10 Essential (primary) hypertension: Secondary | ICD-10-CM

## 2023-04-16 MED ORDER — AMOXICILLIN 500 MG PO TABS
1000.0000 mg | ORAL_TABLET | Freq: Two times a day (BID) | ORAL | 0 refills | Status: DC
Start: 2023-04-16 — End: 2023-05-05

## 2023-04-16 MED ORDER — PREDNISONE 10 MG (21) PO TBPK
ORAL_TABLET | ORAL | 0 refills | Status: DC
Start: 2023-04-16 — End: 2023-05-05

## 2023-04-16 NOTE — Telephone Encounter (Signed)
 Copied from CRM [id number]. Topic: Clinical - Medical Advice >> Apr 16, 2023  8:49 AM Tonya Perkins wrote: Reason for CRM: patient called stated she is experiencing green mucus from cough and blowing her nose. Patient is requesting a medication to clear up her symptoms as she has been taking Mucinex and NyQuil for a week and it is not working Please f/u with patient  Chief Complaint: Productive cough Symptoms: Congestion, mild SOB Frequency: A week Pertinent Negatives: Patient denies chest pain and wheezing Disposition: [] ED /[] Urgent Care (no appt availability in office) / [x] Appointment(In office/virtual)/ []  [place] [facility]/ [] Home Care/ [] Refused Recommended Disposition /[] [place] [facility]/ []  Follow-up with PCP Additional Notes: Patient called in to report a cough and sinus symptoms that have been occurring for a week. Patient stated the mucous she is producing from the cough and nose is green in color. Patient stated she has a history of asthma and experiences mild SOB when laying flat. Patient stated that she does not experience SOB at rest and when sitting up. Patient able to speak in clear and complete sentences while on the phone with this RN. No audible work of breathing detected. Patient denied wheezing, chest pain and nausea. This RN advised patient to be seen within 4 hours. This RN scheduled same day virtual appointment with PCP. This RN advised patient to call back if symptoms worsen. Patient complied.   Reason for Disposition  [1] MILD difficulty breathing (e.g., minimal/no SOB at rest, SOB with walking, pulse <100) AND [2] still present when not coughing  Answer Assessment - Initial Assessment Questions 1. ONSET: "When did the cough begin?"      A week ago 2. SEVERITY: "How bad is the cough today?"      Cough wakes patient from sleep, coughing spells throughout the day 3. SPUTUM: "Describe the color of your sputum" (none, dry cough; clear, white, yellow, green)     Green   4. HEMOPTYSIS: "Are you coughing up any blood?" If so ask: "How much?" (flecks, streaks, tablespoons, etc.)     Denies 5. DIFFICULTY BREATHING: "Are you having difficulty breathing?" If Yes, ask: "How bad is it?" (e.g., mild, moderate, severe)    - MILD: No SOB at rest, mild SOB with walking, speaks normally in sentences, can lie down, no retractions, pulse < 100.    - MODERATE: SOB at rest, SOB with minimal exertion and prefers to sit, cannot lie down flat, speaks in phrases, mild retractions, audible wheezing, pulse 100-120.    - SEVERE: Very SOB at rest, speaks in single words, struggling to breathe, sitting hunched forward, retractions, pulse > 120      States she has asthma, SOB when lying flat, denies SOB at rest and when sitting up 6. FEVER: "Do you have a fever?" If Yes, ask: "What is your temperature, how was it measured, and when did it start?"     Denies 7. CARDIAC HISTORY: "Do you have any history of heart disease?" (e.g., heart attack, congestive heart failure)      Denies 8. LUNG HISTORY: "Do you have any history of lung disease?"  (e.g., pulmonary embolus, asthma, emphysema)     Asthma 9. PE RISK FACTORS: "Do you have a history of blood clots?" (or: recent major surgery, recent prolonged travel, bedridden)     Denies 10. OTHER SYMPTOMS: "Do you have any other symptoms?" (e.g., runny nose, wheezing, chest pain)       Cough wakes her up from sleep, green nasal  discharge, denies wheezing, denies chest pain, denies nausea  Protocols used: Cough - Acute Productive-A-AH

## 2023-04-16 NOTE — Progress Notes (Signed)
 Start time: 9:41 End time: 10:00  Virtual Visit via Video Note  I connected with Tonya Perkins on 04/16/23 by a video enabled telemedicine application and verified that I am speaking with the correct person using two identifiers.  Location: Patient: home Provider: office   I discussed the limitations of evaluation and management by telemedicine and the availability of in person appointments. The patient expressed understanding and agreed to proceed.  History of Present Illness:  Chief Complaint  Patient presents with   Cough    VIRTUAL cough and mild SOB, no fever over a week. Mucus is green in color. Did a home covid test 4 days ago and it was negative. No body aches.    Feels like a common cold, for over a week. Started out with nasal congestion and scratchy throat. Home COVID test was negative 4 days ago (at least 3 days of symptoms). No body aches, fever or chills. Nasal drainage was clear initially, but has been green off/on over the last week.  It varies between clear and green throughout the day.  Drainage is more from left nostril, down the back of the left side of her throat. Coughs up dark green phlegm in the morning, lighter green throughout the day. She is coughing up purulent phlegm during the night, waking her up. Still has nagging cough, postnasal drainage. Taking Mucniex FastMax during the day, nyquil at night.  She has been needing to use her albuterol 2-3 times/day, which does seem to help. She is compliant with her preventative inhalers.  HTN--she is taking hydrochlorothiazide daily.  BP was up some this morning.  She took Nyquil (with phenylephrine) at 3am.    PMH, PSH, SH reviewed  Outpatient Encounter Medications as of 04/16/2023  Medication Sig Note   albuterol (VENTOLIN HFA) 108 (90 Base) MCG/ACT inhaler Inhale 2 puffs into the lungs every 6 (six) hours as needed for wheezing or shortness of breath. 04/16/2023: Used this am   Cholecalciferol (VITAMIN  D3) 125 MCG (5000 UT) CAPS Take 1 capsule by mouth daily.    ferrous sulfate 325 (65 FE) MG EC tablet Take 325 mg by mouth daily.    fluticasone furoate-vilanterol (BREO ELLIPTA) 200-25 MCG/INH AEPB Inhale 1 puff into the lungs daily.    hydrochlorothiazide (HYDRODIURIL) 25 MG tablet Take 1.5 tablets (37.5 mg total) by mouth daily. (Patient taking differently: Take 37.5 mg by mouth daily.) 04/16/2023: Taking 1 tablet daily   levocetirizine (XYZAL) 5 MG tablet Take 5 mg by mouth in the morning and at bedtime.    montelukast (SINGULAIR) 10 MG tablet Take 10 mg by mouth at bedtime.    Olopatadine HCl 0.2 % SOLN Place 1 drop into both eyes as needed.    Phenylephrine-APAP-guaiFENesin (MUCINEX FAST-MAX) 10-650-400 MG/20ML LIQD Take 20 mLs by mouth as needed. 04/16/2023: Last took yesterday; also contains dextromethorphan   Prenatal Vit-Fe Fumarate-FA (PRENATAL MULTIVITAMIN) TABS tablet Take 1 tablet by mouth daily at 12 noon.    Pseudoeph-Doxylamine-DM-APAP (NYQUIL PO) Take 30 mLs by mouth as needed. 04/16/2023: Last dose 3am   acetaminophen (TYLENOL) 500 MG tablet Take 1,000 mg by mouth every 6 (six) hours as needed. (Patient not taking: Reported on 04/16/2023) 04/16/2023: As needed   amphetamine-dextroamphetamine (ADDERALL XR) 5 MG 24 hr capsule Take 1 capsule (5 mg total) by mouth daily. (Patient not taking: Reported on 04/16/2023) 04/16/2023: As needed   calcium carbonate (TUMS - DOSED IN MG ELEMENTAL CALCIUM) 500 MG chewable tablet Chew 1 tablet by mouth  as needed for indigestion or heartburn. (Patient not taking: Reported on 04/16/2023) 04/16/2023: As needed   clobetasol ointment (TEMOVATE) 0.05 % Apply 1 Application topically daily as needed. Thin vulvar application on affected vulva as needed (Patient not taking: Reported on 04/16/2023) 04/16/2023: As needed   ibuprofen (ADVIL) 800 MG tablet Take 1 tablet (800 mg total) by mouth every 8 (eight) hours as needed. (Patient not taking: Reported on 04/16/2023)  04/16/2023: As needed   valACYclovir (VALTREX) 500 MG tablet Take 1 tablet twice daily for 5 days for outbreak (Patient not taking: Reported on 04/16/2023) 04/16/2023: As needed   [DISCONTINUED] traMADol (ULTRAM) 50 MG tablet Take 1 tablet (50 mg total) by mouth every 6 (six) hours as needed. (Patient not taking: Reported on 04/16/2023)    No facility-administered encounter medications on file as of 04/16/2023.   No Known Allergies  ROS: no fever, chills, chest pain, n/v/d.  Asthma flaring some. URI symptoms per HPI. No other issues.     Observations/Objective:  BP (!) 146/75   Ht 5' 5.5" (1.664 m)   BMI 33.76 kg/m   Pleasant, well-appearing female, sounds mildly congested, in no distress. She is speaking comfortably and easily. She coughed twice during visit, sounded wet. Exam is limited due to the virtual nature of the visit.   Assessment and Plan:  Acute non-recurrent sinusitis, unspecified location - Amox; sinus rinses, mucinex DM BID. - Plan: amoxicillin (AMOXIL) 500 MG tablet  Mild persistent asthma with exacerbation - continue current meds. To start prednisone pack if asthma not improving with other measures in the next 24-36 hours - Plan: predniSONE (STERAPRED UNI-PAK 21 TAB) 10 MG (21) TBPK tablet  Essential hypertension - BP elevated today. To avoid all decongestants, and monitor BP  Risks/SE of meds reviewed, and proper use of sinus rinses discussed. All questions answered.    Stop taking the Mucinex Fast-Max and Nyquil, as these have a decongestant which raise your blood pressure. Instead take Mucinex DM 12 hour, twice daily.  I also recommend sinus rinses once or twice daily (Sinus Rinse kit that you have is perfect)--use boiled or distilled water, not tap water.  Stay well hydrated. Take the antibiotics as directed. I'm sending in a prescription for prednisone, to have on hand in case your wheezing isn't improving with the above measures.  Continue to use the  albuterol every 4-6 hours as needed (and start the prednisone if it doesn't seem to be helping).    Follow Up Instructions:    I discussed the assessment and treatment plan with the patient. The patient was provided an opportunity to ask questions and all were answered. The patient agreed with the plan and demonstrated an understanding of the instructions.   The patient was advised to call back or seek an in-person evaluation if the symptoms worsen or if the condition fails to improve as anticipated.  I spent 24 minutes dedicated to the care of this patient, including pre-visit review of records, face to face time, post-visit ordering of testing and documentation.    Lavonda Jumbo, MD

## 2023-04-16 NOTE — Patient Instructions (Signed)
  Stop taking the Mucinex Fast-Max and Nyquil, as these have a decongestant which raise your blood pressure. Instead take Mucinex DM 12 hour, twice daily.  I also recommend sinus rinses once or twice daily (Sinus Rinse kit that you have is perfect)--use boiled or distilled water, not tap water.  Stay well hydrated. Take the antibiotics as directed. I'm sending in a prescription for prednisone, to have on hand in case your wheezing isn't improving with the above measures.  Continue to use the albuterol every 4-6 hours as needed (and start the prednisone if it doesn't seem to be helping).

## 2023-05-02 ENCOUNTER — Ambulatory Visit: Admitting: Medical

## 2023-05-05 ENCOUNTER — Encounter: Payer: Self-pay | Admitting: Family Medicine

## 2023-05-05 ENCOUNTER — Ambulatory Visit: Admitting: Family Medicine

## 2023-05-05 VITALS — BP 124/84 | HR 76 | Ht 65.0 in | Wt 214.6 lb

## 2023-05-05 DIAGNOSIS — I10 Essential (primary) hypertension: Secondary | ICD-10-CM | POA: Diagnosis not present

## 2023-05-05 DIAGNOSIS — M7918 Myalgia, other site: Secondary | ICD-10-CM | POA: Diagnosis not present

## 2023-05-05 DIAGNOSIS — M7062 Trochanteric bursitis, left hip: Secondary | ICD-10-CM

## 2023-05-05 MED ORDER — MELOXICAM 15 MG PO TABS
15.0000 mg | ORAL_TABLET | Freq: Every day | ORAL | 0 refills | Status: DC
Start: 2023-05-05 — End: 2023-11-05

## 2023-05-05 NOTE — Patient Instructions (Addendum)
 You have some tightness and discomfort in the piriformis muscle. Most of your discomfort is related to the hip bursitis.  Take meloxicam once daily with food. If it bothers your stomach, cut the dose in half. Take it once daily (with food) until your pain has completely resolved (likely 7-10 days, but fine to use for the full 15 day course, if needed). If you don't get resolution of your pain, options include physical therapy, or returning for a cortisone injection. You can also try topical voltaren gel, if needed (after finish the oral anti-inflammatory).  Do the piriformis stretches as shown (crossing the left ankle over the right knee, and flexing at the hip (pulling the leg towards you if laying down, or leaning forwards to touch your toes if doing it seated).  Do not take meloxicam with ibuprofen/motrin/advil, naproxen/aleve, BC or Goody Powder. You MAY take tylenol along with it, if needed for additional pain relief.

## 2023-05-05 NOTE — Progress Notes (Signed)
 Chief Complaint  Patient presents with   Hip Pain    Left hip pain and burning when she lays on her right side or stands for long periods of time.    For 2-3 weeks she has been having pain at her lateral left hip.  It burns, stings. 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  She tries to wear good sneakers when working. It hurts to lay on her left side. Denies any change in activity or trauma. She has been taking 600 mg of ibuprofen sporadically, with temporary relief.  Her cousin, a Land, worked on her recently, told her legs were different lengths. She has had some discomfort in the L buttock since then.  Denies radiation into the leg. Denies back pain.   PMH, PSH, SH reviewed  Outpatient Encounter Medications as of 05/05/2023  Medication Sig Note   Cholecalciferol (VITAMIN D3) 125 MCG (5000 UT) CAPS Take 1 capsule by mouth daily.    fluticasone furoate-vilanterol (BREO ELLIPTA) 200-25 MCG/INH AEPB Inhale 1 puff into the lungs daily.    hydrochlorothiazide (HYDRODIURIL) 25 MG tablet Take 1.5 tablets (37.5 mg total) by mouth daily. (Patient taking differently: Take 25 mg by mouth daily.) 04/16/2023: Taking 1 tablet daily   levocetirizine (XYZAL) 5 MG tablet Take 5 mg by mouth in the morning and at bedtime.    montelukast (SINGULAIR) 10 MG tablet Take 10 mg by mouth at bedtime.    Olopatadine HCl 0.2 % SOLN Place 1 drop into both eyes as needed.    Prenatal Vit-Fe Fumarate-FA (PRENATAL MULTIVITAMIN) TABS tablet Take 1 tablet by mouth daily at 12 noon.    acetaminophen (TYLENOL) 500 MG tablet Take 1,000 mg by mouth every 6 (six) hours as needed. (Patient not taking: Reported on 04/16/2023) 05/05/2023: As needed   albuterol (VENTOLIN HFA) 108 (90 Base) MCG/ACT inhaler Inhale 2 puffs into the lungs every 6 (six) hours as needed for wheezing or shortness of breath. (Patient not taking: Reported on 05/05/2023)  05/05/2023: As needed   amphetamine-dextroamphetamine (ADDERALL XR) 5 MG 24 hr capsule Take 1 capsule (5 mg total) by mouth daily. (Patient not taking: Reported on 04/16/2023) 05/05/2023: As needed   calcium carbonate (TUMS - DOSED IN MG ELEMENTAL CALCIUM) 500 MG chewable tablet Chew 1 tablet by mouth as needed for indigestion or heartburn. (Patient not taking: Reported on 04/16/2023) 05/05/2023: As needed   clobetasol ointment (TEMOVATE) 0.05 % Apply 1 Application topically daily as needed. Thin vulvar application on affected vulva as needed (Patient not taking: Reported on 04/16/2023) 05/05/2023: As needed   ibuprofen (ADVIL) 800 MG tablet Take 1 tablet (800 mg total) by mouth every 8 (eight) hours as needed. (Patient not taking: Reported on 05/05/2023) 05/05/2023: As needed   valACYclovir (VALTREX) 500 MG tablet Take 1 tablet twice daily for 5 days for outbreak (Patient not taking: Reported on 04/16/2023) 05/05/2023: As needed   [DISCONTINUED] amoxicillin (AMOXIL) 500 MG tablet Take 2 tablets (1,000 mg total) by mouth 2 (two) times daily.    [DISCONTINUED] ferrous sulfate 325 (65 FE) MG EC tablet Take 325 mg by mouth daily.    [DISCONTINUED] Phenylephrine-APAP-guaiFENesin (MUCINEX FAST-MAX) 10-650-400 MG/20ML LIQD Take 20 mLs by mouth as needed. 04/16/2023: Last took yesterday; also contains dextromethorphan   [DISCONTINUED] predniSONE (STERAPRED UNI-PAK 21 TAB) 10 MG (21) TBPK tablet Take as directed, with food, for asthma exacerbation    [DISCONTINUED] Pseudoeph-Doxylamine-DM-APAP (NYQUIL PO) Take 30  mLs by mouth as needed. 04/16/2023: Last dose 3am   No facility-administered encounter medications on file as of 05/05/2023.   No Known Allergies   ROS: no f/c. No n/v/d or abdominal pain.  No CP or SOB. He easy bruising/bleeding or rash. Slight lingering cough (mucinex helps). No recent angioedema/lip swelling. No back pain, numbness, tingling, weakness.   PHYSICAL EXAM:  BP 124/84   Pulse 76   Ht  5\' 5"  (1.651 m)   Wt 214 lb 9.6 oz (97.3 kg)   LMP 05/01/2023   BMI 35.71 kg/m   Wt Readings from Last 3 Encounters:  05/05/23 214 lb 9.6 oz (97.3 kg)  12/05/22 206 lb (93.4 kg)  08/20/22 221 lb (100.2 kg)   Well-appearing, pleasant female in no distress HEENT: conjunctiva and sclera are clear, EOMI. No lip or swelling. Neck: no lymphadenopathy or mass Heart: regular rate and rhythm Lungs: clear bilaterally, no wheezes, rales Back: spine NT. No SI tenderness. No CVA tenderness Extremities: no edema. Some discomfort with pyriformis stretch on the left.  Normal ROM Tender at L trochanteric bursa, and somewhat above. Psych: normal mood, affect, hygiene and grooming Neuro: alert and oriented, normal strength, sensation, gait.    ASSESSMENT/PLAN:  Trochanteric bursitis, left hip - tx options reviewed (PT, injection, NSAID); start with NSAIDs and home exercises.  NSAID precautions/risks reviewed - Plan: meloxicam (MOBIC) 15 MG tablet  Left buttock pain - in piriformis, no spasm. Stretches shown. NSAIDs will help  Essential hypertension - well controlled   You have some tightness and discomfort in the piriformis muscle. Most of your discomfort is related to the hip bursitis.  Take meloxicam once daily with food. If it bothers your stomach, cut the dose in half. Take it once daily (with food) until your pain has completely resolved (likely 7-10 days, but fine to use for the full 15 day course, if needed). If you don't get resolution of your pain, options include physical therapy, or returning for a cortisone injection. You can also try topical voltaren gel, if needed (after finish the oral anti-inflammatory).  Do the piriformis stretches as shown (crossing the left ankle over the right knee, and flexing at the hip (pulling the leg towards you if laying down, or leaning forwards to touch your toes if doing it seated).

## 2023-06-18 DIAGNOSIS — M62830 Muscle spasm of back: Secondary | ICD-10-CM | POA: Diagnosis not present

## 2023-06-18 DIAGNOSIS — M545 Low back pain, unspecified: Secondary | ICD-10-CM | POA: Diagnosis not present

## 2023-06-18 DIAGNOSIS — M9903 Segmental and somatic dysfunction of lumbar region: Secondary | ICD-10-CM | POA: Diagnosis not present

## 2023-06-18 DIAGNOSIS — M9901 Segmental and somatic dysfunction of cervical region: Secondary | ICD-10-CM | POA: Diagnosis not present

## 2023-06-19 DIAGNOSIS — M9903 Segmental and somatic dysfunction of lumbar region: Secondary | ICD-10-CM | POA: Diagnosis not present

## 2023-06-19 DIAGNOSIS — M62830 Muscle spasm of back: Secondary | ICD-10-CM | POA: Diagnosis not present

## 2023-06-19 DIAGNOSIS — M9901 Segmental and somatic dysfunction of cervical region: Secondary | ICD-10-CM | POA: Diagnosis not present

## 2023-06-19 DIAGNOSIS — M545 Low back pain, unspecified: Secondary | ICD-10-CM | POA: Diagnosis not present

## 2023-06-23 DIAGNOSIS — M9901 Segmental and somatic dysfunction of cervical region: Secondary | ICD-10-CM | POA: Diagnosis not present

## 2023-06-23 DIAGNOSIS — M545 Low back pain, unspecified: Secondary | ICD-10-CM | POA: Diagnosis not present

## 2023-06-23 DIAGNOSIS — M62830 Muscle spasm of back: Secondary | ICD-10-CM | POA: Diagnosis not present

## 2023-06-23 DIAGNOSIS — M9903 Segmental and somatic dysfunction of lumbar region: Secondary | ICD-10-CM | POA: Diagnosis not present

## 2023-06-25 DIAGNOSIS — M9903 Segmental and somatic dysfunction of lumbar region: Secondary | ICD-10-CM | POA: Diagnosis not present

## 2023-06-25 DIAGNOSIS — M9901 Segmental and somatic dysfunction of cervical region: Secondary | ICD-10-CM | POA: Diagnosis not present

## 2023-06-25 DIAGNOSIS — M62830 Muscle spasm of back: Secondary | ICD-10-CM | POA: Diagnosis not present

## 2023-06-25 DIAGNOSIS — M545 Low back pain, unspecified: Secondary | ICD-10-CM | POA: Diagnosis not present

## 2023-06-28 DIAGNOSIS — M9903 Segmental and somatic dysfunction of lumbar region: Secondary | ICD-10-CM | POA: Diagnosis not present

## 2023-06-28 DIAGNOSIS — M62830 Muscle spasm of back: Secondary | ICD-10-CM | POA: Diagnosis not present

## 2023-06-28 DIAGNOSIS — M9901 Segmental and somatic dysfunction of cervical region: Secondary | ICD-10-CM | POA: Diagnosis not present

## 2023-06-28 DIAGNOSIS — M545 Low back pain, unspecified: Secondary | ICD-10-CM | POA: Diagnosis not present

## 2023-06-30 DIAGNOSIS — M9901 Segmental and somatic dysfunction of cervical region: Secondary | ICD-10-CM | POA: Diagnosis not present

## 2023-06-30 DIAGNOSIS — M9903 Segmental and somatic dysfunction of lumbar region: Secondary | ICD-10-CM | POA: Diagnosis not present

## 2023-06-30 DIAGNOSIS — M62830 Muscle spasm of back: Secondary | ICD-10-CM | POA: Diagnosis not present

## 2023-06-30 DIAGNOSIS — M545 Low back pain, unspecified: Secondary | ICD-10-CM | POA: Diagnosis not present

## 2023-07-02 DIAGNOSIS — M9903 Segmental and somatic dysfunction of lumbar region: Secondary | ICD-10-CM | POA: Diagnosis not present

## 2023-07-02 DIAGNOSIS — M545 Low back pain, unspecified: Secondary | ICD-10-CM | POA: Diagnosis not present

## 2023-07-02 DIAGNOSIS — M9901 Segmental and somatic dysfunction of cervical region: Secondary | ICD-10-CM | POA: Diagnosis not present

## 2023-07-02 DIAGNOSIS — M62830 Muscle spasm of back: Secondary | ICD-10-CM | POA: Diagnosis not present

## 2023-07-03 DIAGNOSIS — M9901 Segmental and somatic dysfunction of cervical region: Secondary | ICD-10-CM | POA: Diagnosis not present

## 2023-07-03 DIAGNOSIS — M9903 Segmental and somatic dysfunction of lumbar region: Secondary | ICD-10-CM | POA: Diagnosis not present

## 2023-07-03 DIAGNOSIS — M545 Low back pain, unspecified: Secondary | ICD-10-CM | POA: Diagnosis not present

## 2023-07-03 DIAGNOSIS — M62830 Muscle spasm of back: Secondary | ICD-10-CM | POA: Diagnosis not present

## 2023-07-09 DIAGNOSIS — M545 Low back pain, unspecified: Secondary | ICD-10-CM | POA: Diagnosis not present

## 2023-07-09 DIAGNOSIS — M9903 Segmental and somatic dysfunction of lumbar region: Secondary | ICD-10-CM | POA: Diagnosis not present

## 2023-07-09 DIAGNOSIS — M9901 Segmental and somatic dysfunction of cervical region: Secondary | ICD-10-CM | POA: Diagnosis not present

## 2023-07-09 DIAGNOSIS — M62830 Muscle spasm of back: Secondary | ICD-10-CM | POA: Diagnosis not present

## 2023-07-10 DIAGNOSIS — M9901 Segmental and somatic dysfunction of cervical region: Secondary | ICD-10-CM | POA: Diagnosis not present

## 2023-07-10 DIAGNOSIS — M62830 Muscle spasm of back: Secondary | ICD-10-CM | POA: Diagnosis not present

## 2023-07-10 DIAGNOSIS — M9903 Segmental and somatic dysfunction of lumbar region: Secondary | ICD-10-CM | POA: Diagnosis not present

## 2023-07-10 DIAGNOSIS — M545 Low back pain, unspecified: Secondary | ICD-10-CM | POA: Diagnosis not present

## 2023-07-11 ENCOUNTER — Other Ambulatory Visit (HOSPITAL_BASED_OUTPATIENT_CLINIC_OR_DEPARTMENT_OTHER): Payer: Self-pay

## 2023-07-12 DIAGNOSIS — M9901 Segmental and somatic dysfunction of cervical region: Secondary | ICD-10-CM | POA: Diagnosis not present

## 2023-07-12 DIAGNOSIS — M545 Low back pain, unspecified: Secondary | ICD-10-CM | POA: Diagnosis not present

## 2023-07-12 DIAGNOSIS — M62830 Muscle spasm of back: Secondary | ICD-10-CM | POA: Diagnosis not present

## 2023-07-12 DIAGNOSIS — M9903 Segmental and somatic dysfunction of lumbar region: Secondary | ICD-10-CM | POA: Diagnosis not present

## 2023-07-14 DIAGNOSIS — M545 Low back pain, unspecified: Secondary | ICD-10-CM | POA: Diagnosis not present

## 2023-07-14 DIAGNOSIS — M62838 Other muscle spasm: Secondary | ICD-10-CM | POA: Diagnosis not present

## 2023-07-14 DIAGNOSIS — M542 Cervicalgia: Secondary | ICD-10-CM | POA: Diagnosis not present

## 2023-07-14 DIAGNOSIS — M9901 Segmental and somatic dysfunction of cervical region: Secondary | ICD-10-CM | POA: Diagnosis not present

## 2023-07-14 DIAGNOSIS — M9903 Segmental and somatic dysfunction of lumbar region: Secondary | ICD-10-CM | POA: Diagnosis not present

## 2023-07-14 DIAGNOSIS — M62830 Muscle spasm of back: Secondary | ICD-10-CM | POA: Diagnosis not present

## 2023-07-16 DIAGNOSIS — J3081 Allergic rhinitis due to animal (cat) (dog) hair and dander: Secondary | ICD-10-CM | POA: Diagnosis not present

## 2023-07-16 DIAGNOSIS — J301 Allergic rhinitis due to pollen: Secondary | ICD-10-CM | POA: Diagnosis not present

## 2023-07-16 DIAGNOSIS — J3089 Other allergic rhinitis: Secondary | ICD-10-CM | POA: Diagnosis not present

## 2023-10-28 ENCOUNTER — Other Ambulatory Visit: Payer: Self-pay | Admitting: Family Medicine

## 2023-10-28 DIAGNOSIS — I10 Essential (primary) hypertension: Secondary | ICD-10-CM

## 2023-10-28 MED ORDER — HYDROCHLOROTHIAZIDE 25 MG PO TABS: 37.5000 mg | ORAL_TABLET | Freq: Every day | ORAL | 0 refills | Status: DC

## 2023-10-28 NOTE — Telephone Encounter (Signed)
 Copied from CRM [id number]. Topic: Clinical - Medication Refill >> Oct 28, 2023  3:00 PM Teressa P wrote: Medication: Hydrochlorothiazide   Has the patient contacted their pharmacy? No (This is the patient's preferred pharmacy:   [facility] [address] [place], [place] - [address] [place] [address] Phone: [phone] Fax: [phone]  Is this the correct pharmacy for this prescription? Yes If no, delete pharmacy and type the correct one.   Has the prescription been filled recently? No  Is the patient out of the medication? Yes  Has the patient been seen for an appointment in the last year OR does the patient have an upcoming appointment? Yes  Can we respond through MyChart? No  Agent: Please be advised that Rx refills may take up to 3 business days. We ask that you follow-up with your pharmacy.

## 2023-10-28 NOTE — Telephone Encounter (Signed)
 Copied from CRM [id number]. Topic: Clinical - Medication Refill >> Oct 28, 2023  2:55 PM Teressa P wrote: Medication: Hydrochlorothiazide  25 mg  (Agent: If yes, when and what did the pharmacy advise?)  This is the patient's preferred pharmacy:  [facility] [address] [place], [place] - [address] [place] [address] Phone: [phone] Fax: [phone]  Is this the correct pharmacy for this prescription? Yes If no, delete pharmacy and type the correct one.   Has the prescription been filled recently? No  Last refill was Jan 25  Is the patient out of the medication? No  Has the patient been seen for an appointment in the last year OR does the patient have an upcoming appointment? Yes  Can we respond through MyChart? No  Agent: Please be advised that Rx refills may take up to 3 business days. We ask that you follow-up with your pharmacy.

## 2023-10-29 ENCOUNTER — Encounter: Payer: Self-pay | Admitting: Family Medicine

## 2023-10-29 ENCOUNTER — Ambulatory Visit: Payer: Self-pay

## 2023-10-29 ENCOUNTER — Ambulatory Visit: Admitting: Family Medicine

## 2023-10-29 VITALS — BP 120/84 | HR 96 | Ht 65.0 in | Wt 196.0 lb

## 2023-10-29 DIAGNOSIS — I10 Essential (primary) hypertension: Secondary | ICD-10-CM

## 2023-10-29 DIAGNOSIS — Z23 Encounter for immunization: Secondary | ICD-10-CM

## 2023-10-29 DIAGNOSIS — R634 Abnormal weight loss: Secondary | ICD-10-CM

## 2023-10-29 DIAGNOSIS — J302 Other seasonal allergic rhinitis: Secondary | ICD-10-CM | POA: Diagnosis not present

## 2023-10-29 NOTE — Patient Instructions (Signed)
 HYPERTENSION: Your blood pressure has been elevated, possibly due to the use of Zyrtec D. -Stop taking Zyrtec D. -Monitor your blood pressure regularly and write down the readings. -Bring your blood pressure cuff and list of blood pressure readings to your next appointment to check its accuracy.  ALLERGIC RHINITIS: Your allergy symptoms have improved with Zyrtec D, but it may be causing your blood pressure to rise and is not fully effective for your sinus congestion. -Stop taking Zyrtec D. -Start taking Flonase .  Use 2 gentle sniffs into each nostril, once daily.  If/when your symptoms are well controlled, you can consider decreasing to just 1 spray on each side. Go back to taking Xyzal once daily.  HEADACHE AND NAUSEA: Your headaches and nausea may be related to your elevated blood pressure and sinus congestion or postnasal drainage. -Stop taking Zyrtec D to see if it helps with your headaches. -Use the flonase  and xyzal to help with allergies. -Use Tylenol  as needed to relieve headaches.  UNINTENTIONAL WEIGHT LOSS: Your weight loss is likely due to stress and a decrease in appetite, along with recent bereavement and nausea. -Monitor your weight and make sure you are eating enough nutritious food.

## 2023-10-29 NOTE — Progress Notes (Signed)
 Chief Complaint  Patient presents with   Headache    Patient wakes up, takes dog for a walk and then she comes upstairs and she has a HA/nausea. BP readings have been 131/107 last week. This am 118/90 HR 103.     Tonya Perkins is a 43 year old female with hypertension who presents with headaches and nausea.  Headaches occur in the morning, located in the forehead and sometimes behind the eyes, with associated eye tiredness. She wakes up fine, walks her dog, and about 10-15 mins after getting back, she develops headache. Nausea accompanies the headaches. She takes her blood pressure medication when she returns from her walk (when she has the HA).  Headaches are occurring every other day. Tylenol  provides some relief. She has been taking Zyrtec D for 30 days for allergies and sinus issues, which has improved her sinus symptoms. She previously had been taking Xyzal daily, without adequate treatment of allergies.  Blood pressure readings have been variable, with recent readings of 130/107 mmHg and 118/90 mmHg (today, with headache).  Pulse has been high (97, 103).   She takes HCTZ 37.5 mg (1.5 tablets), denies missed pills. 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   Allergies-- Taking zyrtec-D for the last 30 days, as her sinuses were bothering her. Doesn't use nasal steroid sprays. Denies allergy symptoms or sinus pain currently.    PMH, PSH, SH reviewed. FH updated--mat aunt passed away from CVA (died 09-07-2023, had CVA in February 07, 2023)  Outpatient Encounter Medications as of 10/29/2023  Medication Sig Note   acetaminophen  (TYLENOL ) 500 MG tablet Take 1,000 mg by mouth every 6 (six) hours as needed. 10/29/2023: Took 500mg  yesterday and this am   albuterol  (VENTOLIN  HFA) 108 (90 Base) MCG/ACT inhaler Inhale 2 puffs into the lungs every 6 (six) hours as needed for wheezing or shortness of  breath. 10/29/2023: As needed   Cholecalciferol (VITAMIN D3) 125 MCG (5000 UT) CAPS Take 1 capsule by mouth daily.    clobetasol  ointment (TEMOVATE ) 0.05 % Apply 1 Application topically daily as needed. Thin vulvar application on affected vulva as needed 10/29/2023: As needed   fluticasone  furoate-vilanterol (BREO ELLIPTA) 200-25 MCG/INH AEPB Inhale 1 puff into the lungs daily.    hydrochlorothiazide  (HYDRODIURIL ) 25 MG tablet Take 1.5 tablets (37.5 mg total) by mouth daily.    montelukast  (SINGULAIR ) 10 MG tablet Take 10 mg by mouth at bedtime.    Olopatadine HCl 0.2 % SOLN Place 1 drop into both eyes as needed.    Prenatal Vit-Fe Fumarate-FA (PRENATAL MULTIVITAMIN) TABS tablet Take 1 tablet by mouth daily at 12 noon.    amphetamine -dextroamphetamine (ADDERALL  XR) 5 MG 24 hr capsule Take 1 capsule (5 mg total) by mouth daily. (Patient not taking: Reported on 10/29/2023) 10/29/2023: As needed   ibuprofen  (ADVIL ) 800 MG tablet Take 1 tablet (800 mg total) by mouth every 8 (eight) hours as needed. (Patient not taking: Reported on 05/05/2023) 05/05/2023: As needed   levocetirizine (XYZAL) 5 MG tablet Take 5 mg by mouth in the morning and at bedtime. (Patient not taking: Reported on 10/29/2023) 10/29/2023: Switched to zyrtec-D about a month ago, taking that instead of xyzal   meloxicam  (MOBIC ) 15 MG tablet Take 1 tablet (15 mg total) by mouth daily. Take with food, daily until your pain has resolved    valACYclovir  (VALTREX ) 500 MG tablet Take 1 tablet  twice daily for 5 days for outbreak (Patient not taking: Reported on 10/29/2023) 10/29/2023: As needed   [DISCONTINUED] calcium carbonate (TUMS - DOSED IN MG ELEMENTAL CALCIUM) 500 MG chewable tablet Chew 1 tablet by mouth as needed for indigestion or heartburn. (Patient not taking: Reported on 04/16/2023) 05/05/2023: As needed   No facility-administered encounter medications on file as of 10/29/2023.    No Known Allergies   MND:Joozmhpzd per HPI, improved HA and nausea  per HPI. No vomiting. No CP, shortness of breath. Bowels are normal. No fever, chills. Mild grief, overall moods are okay. No breast tenderness. No palpitations, changes to hair/skin/nails.   PHYSICAL EXAM:  BP (!) 130/90   Pulse 96   Ht 5' 5 (1.651 m)   Wt 196 lb (88.9 kg)   LMP 10/11/2023   BMI 32.62 kg/m   Wt Readings from Last 3 Encounters:  10/29/23 196 lb (88.9 kg)  05/05/23 214 lb 9.6 oz (97.3 kg)  12/05/22 206 lb (93.4 kg)   Pleasant, well-appearing female in no distress HEENT: conjunctiva and sclera are clear, EOMI.  Nasal mucosa with mod edema, clear muucs on R, no drainage on the left.   No frontal or maxillary sinus tenderness. OP clear NECK: No cervical lymphadenopathy or mass Heart: regular rate and rhythm Lungs: clear bilaterally EXTREMITIES: No edema. Psych: normal mood, affect, hygiene and grooming Neuro: alert and oriented, cranial nerves grossly intact, normal gait.    ASSESSMENT/PLAN:  Essential hypertension - BP and pulse above goal, suspect related to use of pseudoephedrine (zyrtec-D). Stop decongestant, monitor BP. Bring monitor and BP list to visit next week  Seasonal allergic rhinitis, unspecified trigger - switch from zyrtec-D to Xyzal and flonase , reviewed proper use.  Weight loss - not exactly intentional, but healthier. Encouraged proper diet. Discussed grieving. No thyroid  sx or other red flags  Need for influenza vaccination - Plan: Flu vaccine trivalent PF, 6mos and older(Flulaval,Afluria,Fluarix,Fluzone)  Nausea--?related to PND Vs related to HA.  F/u next week with list of BP's and monitor to verify accuracy. Will need c-met, not lipids. ?D or others. Will review. Pt doesn't need to be fasting.
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
## 2023-10-31 ENCOUNTER — Telehealth: Payer: Self-pay

## 2023-10-31 NOTE — Telephone Encounter (Signed)
 Copied from CRM [id number]. Topic: Clinical - Prescription Issue >> Oct 31, 2023 10:23 AM Leonette SQUIBB wrote: Reason for CRM: pt called saying she was in earlier this week and was supposed to get an inhaler sent to the pharmacy but it was never sent.

## 2023-10-31 NOTE — Telephone Encounter (Signed)
 Chart reviewed-- She gets her inhalers from her allergist, we don't prescribe them. Did she request them from her pharmacy? If so, the request should go to the prescribing physician.  We did not discuss this at her visit, no mention in chief complaint that she asked CMA for refill

## 2023-11-03 NOTE — Telephone Encounter (Signed)
 Left message for patient

## 2023-11-04 NOTE — Progress Notes (Unsigned)
 No chief complaint on file.  Patient was seen last week with complaints of headaches, nausea and elevated blood pressures. She is compliant with taking hydrochlorothiazide  37.5 mg daily. It was felt that pseudoephedrine in her Zyrtec D (which she had been taking for a month) might be contributing to the higher BP's. She was advised to stop the zyrtec D, monitor BP at home, and bring list of BP's to today's visit, along with monitor to verify the accuracy.  She was advised to switch to Xyzal and flonase , in place of the zyrtec-D.  Unclear if the nausea could be related to postnasal drainage from allergies, vs related to the headaches.  Today she reports ***  BP's are running ***   She was seen in March with L trochanteric bursitis. She was prescribed Meloxicam . ***UPDATE    Iron deficiency anemia: She has a h/o recurrent submucosal fibroids. She had hysteroscopy/myomectomy in 11/2022 with Dr. Yalcinkaya (fertility specialist).  Notes also mentioned elevated testosterone  level, and to start metformin. Lab were not received from their office.  Lab Results  Component Value Date   WBC 6.6 07/01/2022   HGB 10.8 (L) 07/01/2022   HCT 35.4 07/01/2022   MCV 95 07/01/2022   PLT 318 07/01/2022   Lab Results  Component Value Date   IRON 53 07/01/2022   FERRITIN 11 (L) 07/01/2022    She is under the care of allergist for allergies and asthma.  She is using Breo, singulair , and albuterol  prn for asthma.  She uses albuterol  2x/week, preventatively, prior to exercise (or going into a hot house). Denies needing it as rescue.  Allergies as reported above.    Vitamin D  deficiency was noted 06/2019, with level of 11.2.  Last level was normal, when taking 5000 IU daily (most days).  She is currently taking 5000 IU daily   Component Ref Range & Units (hover) 1 yr ago 2 yr ago 4 yr ago  Vit D, 25-Hydroxy 52.7 44.4 CM 11.2 Low     Weight gain/obesity: At her physical she reported eating later  and drinking more juice. No fast foods. Fried chicken, sometimes air-fries. Chips, fried shrimp and french fries. Eats okra, green beans, black-eyed beans, corn, pinto beans, brocolli. Beverages--half-half lemonade and sweet tea. Exercising regularly.    PMH, PSH, SH reviewed   ROS: no fever or chills. Allergies per HPI  HA and nausea per HPI.   Bowels are normal. No CP, shortness of breath. No palpitations, changes to hair/skin/nails.  Hip pain?    PHYSICAL EXAM:  LMP 10/11/2023  Wt Readings from Last 3 Encounters:  10/29/23 196 lb (88.9 kg)  05/05/23 214 lb 9.6 oz (97.3 kg)  12/05/22 206 lb (93.4 kg)   Pleasant, well-appearing female in no distress HEENT: conjunctiva and sclera are clear, EOMI. Sinuses nontender. NECK: No cervical lymphadenopathy or mass Heart: regular rate and rhythm Lungs: clear bilaterally Abdomen: soft, nontender, no mass Back: no spinal or CVA tenderness Extremities: No edema. Psych: normal mood, affect, hygiene and grooming Neuro: alert and oriented, cranial nerves grossly intact, normal gait.  ***nasal exam? Hip tenderness?   ASSESSMENT/PLAN:  Should be bringing BP monitor to verify accuracy, and list of BP's  Vitamin D  if not still taking 5000 IU daily C-met Cbc, ferritin (unless labs done recently by GYN/fertility doctor--I never got lab results done by Dr. Johnston in 09/2022, unsure if she has gone back since)  Message about inhaler refill request--did she ask you last week? Didn't mention to me.  She gets these from her allergist, I don't think we have prescribed these for her. I asked to ensure that she requested these through the pharmacy, so that it would get directed to proper physican, but Carlo had to leave message

## 2023-11-05 ENCOUNTER — Encounter: Payer: Self-pay | Admitting: Family Medicine

## 2023-11-05 ENCOUNTER — Ambulatory Visit (INDEPENDENT_AMBULATORY_CARE_PROVIDER_SITE_OTHER): Admitting: Family Medicine

## 2023-11-05 VITALS — BP 124/98 | HR 82 | Ht 65.0 in | Wt 204.4 lb

## 2023-11-05 DIAGNOSIS — Z6834 Body mass index (BMI) 34.0-34.9, adult: Secondary | ICD-10-CM

## 2023-11-05 DIAGNOSIS — I10 Essential (primary) hypertension: Secondary | ICD-10-CM

## 2023-11-05 DIAGNOSIS — J4530 Mild persistent asthma, uncomplicated: Secondary | ICD-10-CM

## 2023-11-05 DIAGNOSIS — J302 Other seasonal allergic rhinitis: Secondary | ICD-10-CM

## 2023-11-05 DIAGNOSIS — D500 Iron deficiency anemia secondary to blood loss (chronic): Secondary | ICD-10-CM

## 2023-11-05 DIAGNOSIS — E559 Vitamin D deficiency, unspecified: Secondary | ICD-10-CM | POA: Diagnosis not present

## 2023-11-05 MED ORDER — HYDROCHLOROTHIAZIDE 25 MG PO TABS: 37.5000 mg | ORAL_TABLET | Freq: Every day | ORAL | 0 refills | Status: AC

## 2023-11-05 NOTE — Patient Instructions (Addendum)
 Try and use sinus rinses daily (up to twice daily). Start the Flonase  (over-the-counter). Continue the xyzal.  Schedule follow-up with your allergist if not improving.  I have no reservations about you taking Metformin if this is recommended by the fertility doctors.  The main issue is side effects, which you did not have.  We mentioned taking metamucil if you have any loose stools from the medication.  Consider using your albuterol  PRIOR to exercise for times where you are more likely to have trouble with breathing during your workout (ie hot/cold temps, cold or allergies flaring).  Try any carbonated beverage when you need to burp--try seltzer, stay away from regular sodas.  Look at the sugars on the juice. I'd rather you eat the fruit, and less juice.  Continue to monitor your blood pressure regularly. Try and be better about taking your hydrochlorothiazide  EVERY day (use a pill box if needed). Please send me a list of your blood pressures through MyChart in a month. Set a reminder on your phone to send us  the photo of your blood pressure log.

## 2023-11-06 ENCOUNTER — Ambulatory Visit: Payer: Self-pay | Admitting: Family Medicine

## 2023-11-06 LAB — CBC WITH DIFFERENTIAL/PLATELET
Basophils Absolute: 0.1 x10E3/uL (ref 0.0–0.2)
Basos: 1 %
EOS (ABSOLUTE): 0.2 x10E3/uL (ref 0.0–0.4)
Eos: 3 %
Hematocrit: 42.2 % (ref 34.0–46.6)
Hemoglobin: 14.1 g/dL (ref 11.1–15.9)
Immature Grans (Abs): 0 x10E3/uL (ref 0.0–0.1)
Immature Granulocytes: 0 %
Lymphocytes Absolute: 2 x10E3/uL (ref 0.7–3.1)
Lymphs: 33 %
MCH: 34.1 pg — ABNORMAL HIGH (ref 26.6–33.0)
MCHC: 33.4 g/dL (ref 31.5–35.7)
MCV: 102 fL — ABNORMAL HIGH (ref 79–97)
Monocytes Absolute: 0.4 x10E3/uL (ref 0.1–0.9)
Monocytes: 6 %
Neutrophils Absolute: 3.4 x10E3/uL (ref 1.4–7.0)
Neutrophils: 57 %
Platelets: 321 x10E3/uL (ref 150–450)
RBC: 4.14 x10E6/uL (ref 3.77–5.28)
RDW: 12.8 % (ref 11.7–15.4)
WBC: 6 x10E3/uL (ref 3.4–10.8)

## 2023-11-06 LAB — COMPREHENSIVE METABOLIC PANEL WITH GFR
ALT: 18 IU/L (ref 0–32)
AST: 19 IU/L (ref 0–40)
Albumin: 3.9 g/dL (ref 3.9–4.9)
Alkaline Phosphatase: 69 IU/L (ref 44–121)
BUN/Creatinine Ratio: 11 (ref 9–23)
BUN: 10 mg/dL (ref 6–24)
Bilirubin Total: 0.3 mg/dL (ref 0.0–1.2)
CO2: 22 mmol/L (ref 20–29)
Calcium: 9.3 mg/dL (ref 8.7–10.2)
Chloride: 105 mmol/L (ref 96–106)
Creatinine, Ser: 0.91 mg/dL (ref 0.57–1.00)
Globulin, Total: 2.5 g/dL (ref 1.5–4.5)
Glucose: 91 mg/dL (ref 70–99)
Potassium: 4.5 mmol/L (ref 3.5–5.2)
Sodium: 139 mmol/L (ref 134–144)
Total Protein: 6.4 g/dL (ref 6.0–8.5)
eGFR: 81 mL/min/1.73 (ref >59)

## 2023-11-06 LAB — FERRITIN: Ferritin: 29 ng/mL (ref 15–150)

## 2023-11-07 ENCOUNTER — Encounter: Payer: Self-pay | Admitting: Family Medicine

## 2023-11-11 ENCOUNTER — Other Ambulatory Visit (HOSPITAL_COMMUNITY)
Admission: RE | Admit: 2023-11-11 | Discharge: 2023-11-11 | Disposition: A | Discharge status: Home / Self Care | Source: Ambulatory Visit | Attending: Obstetrics and Gynecology | Admitting: Obstetrics and Gynecology

## 2023-11-11 ENCOUNTER — Ambulatory Visit (INDEPENDENT_AMBULATORY_CARE_PROVIDER_SITE_OTHER): Admitting: Obstetrics and Gynecology

## 2023-11-11 ENCOUNTER — Encounter: Payer: Self-pay | Admitting: Obstetrics and Gynecology

## 2023-11-11 VITALS — BP 112/78 | HR 88 | Ht 66.0 in | Wt 198.0 lb

## 2023-11-11 DIAGNOSIS — Z01419 Encounter for gynecological examination (general) (routine) without abnormal findings: Secondary | ICD-10-CM | POA: Insufficient documentation

## 2023-11-11 DIAGNOSIS — Z1331 Encounter for screening for depression: Secondary | ICD-10-CM | POA: Diagnosis not present
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
## 2023-11-12 ENCOUNTER — Ambulatory Visit: Payer: Self-pay | Admitting: Obstetrics and Gynecology

## 2023-11-12 LAB — SURESWAB® ADVANCED VAGINITIS PLUS,TMA
C. trachomatis RNA, TMA: NOT DETECTED
CANDIDA SPECIES: NOT DETECTED
Candida glabrata: NOT DETECTED
N. gonorrhoeae RNA, TMA: NOT DETECTED
SURESWAB(R) ADV BACTERIAL VAGINOSIS(BV),TMA: NEGATIVE
TRICHOMONAS VAGINALIS (TV),TMA: NOT DETECTED

## 2023-11-12 LAB — CYTOLOGY - PAP
Comment: NEGATIVE
Diagnosis: NEGATIVE
High risk HPV: NEGATIVE

## 2023-11-12 NOTE — Telephone Encounter (Signed)
 Dr. Glennon -I do not see that a testosterone  level was collected, please advise

## 2023-11-13 ENCOUNTER — Other Ambulatory Visit: Payer: Self-pay

## 2023-11-13 DIAGNOSIS — Z01419 Encounter for gynecological examination (general) (routine) without abnormal findings: Secondary | ICD-10-CM

## 2023-11-14 LAB — ANTI-MULLERIAN HORMONE (AMH), FEMALE: Anti-Mullerian Hormones(AMH), Female: 1.84 ng/mL (ref 0.01–2.99)

## 2023-11-17 LAB — HEMOGLOBIN A1C
Hgb A1c MFr Bld: 5.6 % (ref <5.7)
Mean Plasma Glucose: 114 mg/dL
eAG (mmol/L): 6.3 mmol/L

## 2023-11-17 LAB — HEPATITIS B SURFACE ANTIGEN: Hepatitis B Surface Ag: NONREACTIVE

## 2023-11-17 LAB — LIPID PANEL
Cholesterol: 178 mg/dL (ref <200)
HDL: 66 mg/dL (ref >=50)
LDL Cholesterol (Calc): 87 mg/dL
Non-HDL Cholesterol (Calc): 112 mg/dL (ref <130)
Total CHOL/HDL Ratio: 2.7 (calc) (ref <5.0)
Triglycerides: 151 mg/dL — ABNORMAL HIGH (ref <150)

## 2023-11-17 LAB — RPR: RPR Ser Ql: NONREACTIVE

## 2023-11-17 LAB — HIV ANTIBODY (ROUTINE TESTING W REFLEX)
HIV 1&2 Ab, 4th Generation: NONREACTIVE
HIV FINAL INTERPRETATION: NEGATIVE

## 2023-11-17 LAB — HEPATITIS C ANTIBODY: Hepatitis C Ab: NONREACTIVE

## 2023-11-17 LAB — TSH: TSH: 0.49 m[IU]/L

## 2023-11-26 ENCOUNTER — Encounter: Payer: Self-pay | Admitting: Family Medicine

## 2023-12-18 ENCOUNTER — Other Ambulatory Visit: Payer: Self-pay

## 2023-12-25 ENCOUNTER — Encounter: Payer: Self-pay | Admitting: Radiology

## 2023-12-25 ENCOUNTER — Ambulatory Visit (INDEPENDENT_AMBULATORY_CARE_PROVIDER_SITE_OTHER): Admitting: Radiology

## 2023-12-25 VITALS — BP 142/96 | HR 86 | Temp 98.3°F | Wt 193.0 lb

## 2023-12-25 DIAGNOSIS — N898 Other specified noninflammatory disorders of vagina: Secondary | ICD-10-CM

## 2023-12-25 DIAGNOSIS — B3731 Acute candidiasis of vulva and vagina: Secondary | ICD-10-CM | POA: Diagnosis not present

## 2023-12-25 LAB — WET PREP FOR TRICH, YEAST, CLUE

## 2023-12-25 MED ORDER — FLUCONAZOLE 150 MG PO TABS
150.0000 mg | ORAL_TABLET | ORAL | 0 refills | Status: AC
Start: 2023-12-25 — End: ?
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
## 2024-01-26 ENCOUNTER — Encounter: Payer: Self-pay | Admitting: Family Medicine

## 2024-01-27 ENCOUNTER — Encounter: Payer: Self-pay | Admitting: Obstetrics and Gynecology

## 2024-02-11 ENCOUNTER — Telehealth: Payer: Self-pay | Admitting: *Deleted

## 2024-02-11 ENCOUNTER — Other Ambulatory Visit: Payer: Self-pay | Admitting: Obstetrics and Gynecology

## 2024-02-11 MED ORDER — METRONIDAZOLE 500 MG PO TABS: 500.0000 mg | ORAL_TABLET | Freq: Two times a day (BID) | ORAL | 0 refills | Status: AC

## 2024-02-11 NOTE — Telephone Encounter (Signed)
 Spoke with patient. She states that for about 3 days she has had vaginal odor. She took an at home BV test and it was positive. Please advise if patient needs office visit or if you would like to send medication to pharmacy on file.

## 2024-02-11 NOTE — Telephone Encounter (Signed)
 Patient left message requesting call back for treatment of BV. States she has a positive at home BV test.

## 2024-03-17 ENCOUNTER — Ambulatory Visit: Admitting: Radiology

## 2024-03-18 ENCOUNTER — Other Ambulatory Visit: Payer: Self-pay | Admitting: Obstetrics and Gynecology

## 2024-03-18 DIAGNOSIS — Z1231 Encounter for screening mammogram for malignant neoplasm of breast: Secondary | ICD-10-CM

## 2024-04-12 ENCOUNTER — Ambulatory Visit

## 2024-06-24 ENCOUNTER — Encounter: Payer: Self-pay | Admitting: Family Medicine
